# Patient Record
Sex: Female | Born: 1940 | Race: White | Hispanic: No | Marital: Married | Smoking: Never smoker
Health system: Southern US, Community
[De-identification: ages and names within clinical notes are randomized; demographics above are authoritative.]

## PROBLEM LIST (undated history)

## (undated) DIAGNOSIS — K219 Gastro-esophageal reflux disease without esophagitis: Secondary | ICD-10-CM

## (undated) DIAGNOSIS — M1990 Unspecified osteoarthritis, unspecified site: Secondary | ICD-10-CM

## (undated) DIAGNOSIS — I10 Essential (primary) hypertension: Secondary | ICD-10-CM

## (undated) HISTORY — PX: TONSILLECTOMY: SUR1361

## (undated) HISTORY — PX: JOINT REPLACEMENT: SHX530

---

## 1999-11-01 ENCOUNTER — Ambulatory Visit (HOSPITAL_COMMUNITY): Admission: RE | Admit: 1999-11-01 | Discharge: 1999-11-01 | Payer: Self-pay | Admitting: Gastroenterology

## 2004-03-23 ENCOUNTER — Ambulatory Visit: Payer: Self-pay | Admitting: Family Medicine

## 2004-07-27 ENCOUNTER — Ambulatory Visit: Payer: Self-pay | Admitting: Family Medicine

## 2004-11-30 ENCOUNTER — Ambulatory Visit: Payer: Self-pay | Admitting: Family Medicine

## 2005-01-24 ENCOUNTER — Encounter: Admission: RE | Admit: 2005-01-24 | Discharge: 2005-01-24 | Payer: Self-pay | Admitting: Gastroenterology

## 2005-04-07 ENCOUNTER — Ambulatory Visit: Payer: Self-pay | Admitting: Family Medicine

## 2005-08-10 ENCOUNTER — Ambulatory Visit: Payer: Self-pay | Admitting: Family Medicine

## 2006-01-25 ENCOUNTER — Ambulatory Visit: Payer: Self-pay | Admitting: Family Medicine

## 2006-06-01 ENCOUNTER — Ambulatory Visit: Payer: Self-pay | Admitting: Family Medicine

## 2006-11-30 ENCOUNTER — Inpatient Hospital Stay (HOSPITAL_COMMUNITY): Admission: RE | Admit: 2006-11-30 | Discharge: 2006-12-04 | Payer: Self-pay | Admitting: Orthopedic Surgery

## 2007-05-15 ENCOUNTER — Encounter: Admission: RE | Admit: 2007-05-15 | Discharge: 2007-05-15 | Payer: Self-pay | Admitting: Gastroenterology

## 2007-05-23 ENCOUNTER — Encounter: Admission: RE | Admit: 2007-05-23 | Discharge: 2007-05-23 | Payer: Self-pay | Admitting: Gastroenterology

## 2007-07-19 ENCOUNTER — Ambulatory Visit (HOSPITAL_COMMUNITY): Admission: RE | Admit: 2007-07-19 | Discharge: 2007-07-19 | Payer: Self-pay | Admitting: Gastroenterology

## 2007-10-23 ENCOUNTER — Inpatient Hospital Stay (HOSPITAL_COMMUNITY): Admission: RE | Admit: 2007-10-23 | Discharge: 2007-10-25 | Payer: Self-pay | Admitting: General Surgery

## 2007-10-23 ENCOUNTER — Encounter (INDEPENDENT_AMBULATORY_CARE_PROVIDER_SITE_OTHER): Payer: Self-pay | Admitting: General Surgery

## 2010-04-04 HISTORY — PX: LAPAROSCOPIC NISSEN FUNDOPLICATION: SHX1932

## 2010-05-26 ENCOUNTER — Other Ambulatory Visit: Payer: Self-pay | Admitting: Gastroenterology

## 2010-05-26 DIAGNOSIS — K225 Diverticulum of esophagus, acquired: Secondary | ICD-10-CM

## 2010-05-28 ENCOUNTER — Ambulatory Visit
Admission: RE | Admit: 2010-05-28 | Discharge: 2010-05-28 | Disposition: A | Discharge status: Home / Self Care | Payer: Medicare Other | Source: Ambulatory Visit | Attending: Gastroenterology | Admitting: Gastroenterology

## 2010-05-28 DIAGNOSIS — K225 Diverticulum of esophagus, acquired: Secondary | ICD-10-CM

## 2010-08-17 NOTE — Op Note (Signed)
NAMERUBBY, BARBARY               ACCOUNT NO.:  [id number]   MEDICAL RECORD NO.:  [id number]          PATIENT TYPE:  INP   LOCATION:  0007                         FACILITY:  [facility]   PHYSICIAN:  Sharlet Salina T. Hoxworth, M.D.DATE OF BIRTH:  [birth year]   DATE OF PROCEDURE:  10/23/2007  DATE OF DISCHARGE:                               OPERATIVE REPORT   BRIEF HISTORY:  Ms. Runions is a 70 year old female who presents with  worsening episodic postprandial epigastric abdominal pain.  She had a  thorough workup by Dr. Dorena Cookey which has included a gallbladder  ultrasound showing a single 1.5 cm gallstone.  She also has a known  large hiatal hernia with about 50% of her stomach in the chest with  moderate GERD.  Esophageal manometry was unremarkable.  She is felt to  have symptoms consistent with either her hiatal hernia or gallstones and  we have elected to proceed with repair of her hiatal hernia with Nissen  wrap and cholecystectomy laparoscopically.  Ashby Dawes of the procedure,  indications, risks of bleeding, infection, visceral injury, common bile  duct injury, bile leak and anesthetic risks have been discussed and  understood.  She is now brought to the operating room for this  procedure.   DESCRIPTION OF PROCEDURE:  Patient brought to the operating room, placed  in supine position on the operating table and general orotracheal  anesthesia was induced.  The abdomen was widely sterilely prepped and  draped.  She received preoperative antibiotics.  PAS were in place.  Correct patient and procedures were verified.  Access was obtained with  an 11 mm OptiVu trocar in the left subcostal space and pneumoperitoneum  established without difficulty.  Under direct vision, an 11 mm trocar  was placed in the upper right lateral abdomen, another 11 mm trocar in  the right mid abdomen, an 11 mm trocar just above and left of the  umbilicus for the camera port and  a 5 mm trocar in the left flank.  Through a 5 mm epigastric site the [facility] retractor was placed, left  lobe of the liver elevated with excellent exposure of the stomach and  the hiatus.  There was a large hiatal hernia obviously present with, as  expected, about half the stomach up in the thoracic cavity.  The stomach  was reduced back down into the abdominal cavity.  The gastrohepatic  omentum was divided with the harmonic scalpel.  There appeared to be a  replaced hepatic artery which was preserved.  The right crus was exposed  and identified.  Beginning laterally along the right crus, the  peritoneal sac was incised and this was carried out over the top of the  hiatus back down along the left crus.  The large hernia sac was then  completely stripped down out of the mediastinum completely mobilized  down to its attachment at the EG junction and then was removed with the  harmonic scalpel.  The esophagus was identified.  The EG junction  identified.  The short gastric vessels at the fundus were then divided  entering the lesser sac and  this dissection was carried up along the  fundus, dividing short gastrics with the harmonic scalpel and then  further attachments along the base of the left crus were completely  mobilized.  At this point, after complete dissection of the sac, the  stomach laid nicely in the abdomen under no tension with several  centimeters of intra-abdominal esophagus, again, without any tension  whatsoever.  Some further attachments posteriorly at the junction of the  crura were dissected until the EG junction and esophagus were completely  mobilized.  Posterior vagus was identified and protected.  At this  point, a crural repair was performed with interrupted 0 Surgilon sutures  pledgeted working from posterior to anterior, closing the crus down to a  normal hiatal opening.  A SurgiAssist hiatal hernia  soft tissue patch  was then moistened, introduced in the abdomen and oriented and placed  over  the crural repair.  The tails were then sutured together anteriorly  to the esophagus, tacking it to the diaphragm as well with nice broad  coverage of the repair.  It was further held in place with Tisseel  tissue sealant.  A 360 degree fundic wrap was then fashioned, bringing  the fundus back behind the esophagus finding contiguous area of fundus  on the left side for the 360 degree wrap.  A lighted 56 bougie dilator  was then passed and a wrap was constructed over the dilator with  interrupted 0 Surgilon sutures creating about a 3-4 cm wrap.  The  dilator was then removed and the wrap was seen to be nice and floppy.  The operative site was inspected for hemostasis or any evidence of  visceral injury and everything looked fine.  At this point, attention  was then turned to the cholecystectomy.  The Nathanson retractor was  removed and using the same port placement, the fundus of the gallbladder  was grasped, elevated up to the liver, the infundibulum retracted  inferolaterally.  The peritoneum anterior and posterior to Calot's  triangle was incised and fibrofatty tissue was stripped off the neck of  the gallbladder toward the porta hepatis.  Cystic artery and cystic duct  were defined, skeletonized, the cystic duct-gallbladder junction  dissected 360 degrees.  When the anatomy was clear, the cystic duct was  clipped at the gallbladder junction.  The cystic artery was clipped and  operative cholangiogram obtained through the cyst duct.  This showed  good filling of normal common bile duct and intrahepatic ducts with free  flow into the duodenum.  Following this, the cholangiocath was removed  and the cystic duct was triply clipped proximally and divided.  The  cystic artery was further clipped and divided.  The gallbladder was then  dissected free from its bed using hook cautery.  It was placed in an  EndoCatch bag and brought out through one of the 11 mm trocar sites.  Operative site was  thoroughly irrigated and complete hemostasis assured.  Following this, trocars removed and all CO2 was evacuated.  Skin  incisions were closed with subcuticular 4-0 Monocryl and Dermabond.  Sponge, needle and instrument counts were correct.  The patient was  taken to the recovery room in good condition.      Lorne Skeens. Hoxworth, M.D.  Electronically Signed     BTH/MEDQ  D:  10/23/2007  T:  10/23/2007  Job:  [id number]   cc:   Everardo All. Madilyn Fireman, M.D.  Fax: [phone]

## 2010-08-17 NOTE — Op Note (Signed)
Sheri Mccoy, Sheri Mccoy               ACCOUNT NO.:  [id number]   MEDICAL RECORD NO.:  [id number]          PATIENT TYPE:  INP   LOCATION:  0002                         FACILITY:  [facility]   PHYSICIAN:  Ollen Gross, M.D.    DATE OF BIRTH:  02-02-1941   DATE OF PROCEDURE:  11/30/2006  DATE OF DISCHARGE:                               OPERATIVE REPORT   ADDENDUM:  Dictation # for original report:  [id number]   The fascia lata is closed with interrupted #1 Vicryl, subcutaneous  closed with #1 and 2-0 Vicryl, and subcuticular running 4-0 Monocryl.  The incision is clean and dry and Steri-Strips and bulky sterile  dressing applied.  She was placed into a knee immobilizer, awakened, and  transported to the recovery room in stable condition.      Ollen Gross, M.D.  Electronically Signed     FA/MEDQ  D:  11/30/2006  T:  12/01/2006  Job:  [id number]

## 2010-08-17 NOTE — H&P (Signed)
Sheri Mccoy, Sheri Mccoy               ACCOUNT NO.:  [id number]   MEDICAL RECORD NO.:  [id number]          PATIENT TYPE:  INP   LOCATION:  0002                         FACILITY:  [facility]   PHYSICIAN:  Ollen Gross, M.D.    DATE OF BIRTH:  1940/10/22   DATE OF ADMISSION:  11/30/2006  DATE OF DISCHARGE:                              HISTORY & PHYSICAL   DATE OF OFFICE VISIT HISTORY AND PHYSICAL:  November 08, 2006   CHIEF COMPLAINT:  Left hip pain.   HISTORY OF PRESENT ILLNESS:  The patient is a 70 year old female who has  been seen by Dr. Lequita Halt for ongoing and progressive left hip pain.  It  has been getting worse for several years now.  It is more lateral and  radiating down the thigh.  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  It is felt she has reached a point  where she would benefit from undergoing surgical intervention.  Risks  and benefits have been discussed and she elects to proceed with surgery.  She has been seen by Dr. Lysbeth Galas and felt she would be stabilized and  ready for surgery.   ALLERGIES:  No known drug allergies.   CURRENT MEDICATIONS:  Lipitor, meloxicam, Micardis, Fosamax, Prilosec,  glucosamine chondroitin, baby aspirin.   PAST MEDICAL HISTORY:  1. Hypertension.  2. Hiatal hernia.  3. Reflux disease.  4. Hemorrhoids.  5. Osteoporosis.  6. Postmenopausal   PAST SURGICAL HISTORY:  1. Cesarean section.  2. Colonoscopy.  3. EGD.   SOCIAL HISTORY:  Married, retired.  Nonsmoker, no alcohol.  One child.   FAMILY HISTORY:  Father with history of stroke.   REVIEW OF SYSTEMS:  GENERAL:  No fevers, chills, night sweats.  NEURO:  No seizures, syncope, paralysis.  RESPIRATORY:  No shortness of breath,  productive cough or hemoptysis.  CARDIOVASCULAR:  No chest pain, angina,  orthopnea.  GI:  No nausea, vomiting, diarrhea, constipation.  GU:  No  dysuria, hematuria,  discharge.  MUSCULOSKELETAL:  Left hip.   PHYSICAL EXAMINATION:  VITAL SIGNS:  Pulse 80, respirations 12, blood  pressure 158/90.  GENERAL:  A 70 year old white female well-nourished, well-developed,  slightly overweight.  No acute distress.  She is alert, oriented,  cooperative, pleasant.  HEENT: Normocephalic, atraumatic.  Pupils round, reactive.  Oropharynx  clear.  EOMs intact.  NECK:  Supple.  No bruits.  CHEST:  Clear anterior and posterior chest walls.  No rhonchi, rales, or  wheezing.  HEART:  Regular rate and rhythm.  No murmur, S1 and S2 noted.  ABDOMEN:  Soft, nontender.  Bowel sounds present.  RECTAL, BREAST, GENITALIA:  Not done, not pertinent to present illness.  EXTREMITIES:  Left hip:  Left hip shows flexion 90,  zero internal  rotation, zero external rotation, no abduction, antalgic gait.   IMPRESSION:  Osteoarthritis left hip.   PLAN:  The patient will be admitted to [facility] to undergo a  left total replacement arthroplasty.  Surgery will be performed  by Dr.  Homero Fellers Aluisio.      Alexzandrew L. Perkins, P.A.C.      Ollen Gross, M.D.  Electronically Signed    ALP/MEDQ  D:  11/29/2006  T:  11/30/2006  Job:  [id number]   cc:   Delaney Meigs, M.D.  Fax: [phone]

## 2010-08-17 NOTE — Op Note (Signed)
[name], [name]               ACCOUNT NO.:  [id number]   MEDICAL RECORD NO.:  [id number]          PATIENT TYPE:  INP   LOCATION:  0002                         FACILITY:  [facility]   PHYSICIAN:  Ollen Gross, M.D.    DATE OF BIRTH:  28-Jan-1941   DATE OF PROCEDURE:  11/30/2006  DATE OF DISCHARGE:                               OPERATIVE REPORT   PREOPERATIVE DIAGNOSIS:  Osteoarthritis left hip with protrusio  deformity.   POSTOPERATIVE DIAGNOSIS:  Osteoarthritis left hip with protrusio  deformity.   PROCEDURE:  Left total hip arthroplasty with acetabular autografting.   SURGEON:  Ollen Gross, M.D.   ASSISTANT:  Alexzandrew L. Perkins, P.A.C.   ANESTHESIA:  General.   ESTIMATED BLOOD LOSS:  250 mL.   DRAINS:  None.   COMPLICATIONS:  None.   CONDITION:  Stable to recovery.   BRIEF CLINICAL NOTE:  Sheri Mccoy is a 70 year old female who has severe  end-stage arthritis of both hips left more symptomatic than the right.  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  She presents, now,  for a total hip arthroplasty.   PROCEDURE IN DETAIL:  After the successful administration of general  anesthetic, the patient is placed in the right lateral decubitus  position with the left side up and held with the hip positioner.  Left  lower extremity was isolated from the perineum with plastic drapes and  prepped and draped in the usual sterile fashion.   A short posterolateral incision was made with a 10-blade through  subcutaneous tissue to the level of the fascia lata which was incised in  line with the skin incision.  The sciatic nerve was palpated and  protected; and then the short rotator was isolated off the femur.  A  capsulectomy was performed.  There were a large circumferential rim of  osteophytes preventing the hip from being dislocated.  I removed the  osteophytes down to the normal acetabular rim; and then we were able to  dislocate the hip.  The center of  femoral head was marked; and the trial  prosthesis was placed, such that, the center of trial head corresponds  to the center of the native femoral head.  Osteotomy lines marked on the  femoral neck; and osteotomy made with an oscillating saw.   Femoral head was removed; and the femur retracted anteriorly to gain  acetabular exposure.  I removed a large rim of anterior osteophyte.  Retractors were placed and labrum was removed also.  Reaming started  essentially with 43 mm.  We then reamed the rim up to 49 mm.  I placed  some cancellus graft from the femoral head into the base of the  protrusio defect to more effectively create a normal center of rotation  for the acetabular component.  The size 50-mm pinnacle acetabular shell  was then impacted into the acetabulum in anatomic position.  We had  excellent purchase with this; and transfixed it with two additional dome  screws with excellent purchase.  The apex hole eliminator was placed and  then the 36-mm neutral Ultramet metal liner  was placed.   The femur was prepared with canal finder and irrigation.  Axial reaming  was performed to 15.5 mm.  Proximal reaming to 20-D and the sleeve  machine to a small.  A 20-D small trial sleeve is placed with a 20 x 15  stem, and a 36 plus 8 neck which was placed about 5 degrees less than  her normal anteversion.  She was successively anteverted.   The trial 36 with 0 head was placed; and introduced with outstanding  stability.  There was full extension, full external rotation, 70 degrees  flexion, 40 degrees adduction, and 90 degrees internal rotation, 90  degrees of flexion, 70 degrees of internal rotation.  The hip was then  dislocated.  Trials removed.  Permanent 20-D small sleeve was placed  with a 20 x 15 stem, 36 plus 8 neck, slightly less than her native  anteversion.  A 36 plus 0 head was placed and the hip was reduced with  the same stability parameters.  By placing the left leg on top of  the  right, I felt as though the leg lengths were equal.  The wound was then  copiously irrigated with saline solution; and the short rotators  reattached to the femur through drill holes.  The fascia lata was closed  over a Hemovac drain with interrupted   Dictation ended at this point.      Ollen Gross, M.D.  Electronically Signed     FA/MEDQ  D:  11/30/2006  T:  12/01/2006  Job:  [id number]

## 2010-08-20 NOTE — Discharge Summary (Signed)
Sheri Mccoy, Sheri Mccoy               ACCOUNT NO.:  [id number]   MEDICAL RECORD NO.:  [id number]          PATIENT TYPE:  INP   LOCATION:  1603                         FACILITY:  [facility]   PHYSICIAN:  Ollen Gross, M.D.    DATE OF BIRTH:  31-May-1940   DATE OF ADMISSION:  11/30/2006  DATE OF DISCHARGE:  12/04/2006                               DISCHARGE SUMMARY   ADMISSION DIAGNOSES:  1. Osteoarthritis, left hip.  2. Hypertension.  3. Hiatal hernia.  4. Reflux disease.  5. Hemorrhoids.  6. Osteoporosis.  7. Postmenopausal.   DISCHARGE DIAGNOSES:  1. Osteoarthritis, left hip, with protrusio deformity status post left      total hip arthroplasty with acetabular autografting.  2. Mild postoperative blood loss anemia, did not require transfusion.  3. Hypertension.  4. Hiatal hernia.  5. Reflux disease.  6. Hemorrhoids.  7. Osteoporosis.  8. Postmenopausal.   PROCEDURE:  November 30, 2006, left total hip arthroplasty with acetabular  autografting.  Surgeon Dr. Lequita Halt, assistant Avel Peace, PA-C.  Anesthesia general.  Consults none.   BRIEF HISTORY:  Ms. Steppe is a 70 year old female with severe end-  stage arthritis of both hips, left more symptomatic than right.  She has  a severe protrusio deformity and now presents for total hip.   LABORATORY DATA:  Preoperative CBC showed a hemoglobin of 13.7,  hematocrit 40.5, white cell count 10.9.  Postoperative hemoglobin 11.3,  drifting down to 10.6.  Last noted H&H back up to 10.7 and 31.6.  PT/PTT  on admission 12.0 and 26, respectively.  INR 0.9.  Serial protimes  followed.  Last noted PT/INR 20.1 and 1.7.  Chem panel on admission:  Minimally elevated BUN at 25, elevated glucose of 142.  Serial BMETs  were followed.  Sodium and potassium remained within normal limits.  Glucose came down to 120, BUN improved down to 8.  Preoperative UA  negative.  Blood group type A negative.   EKG preoperative, November 24, 2006, normal sinus rhythm,  normal EKG.  No  previous tracings.  Confirmed by Dr. Lucas Mallow.  Chest x-ray on November 24, 2006:  No acute findings, large hiatal hernia.  Left hip films:  Progressive left hip arthropathy with an acetabular protrusio done on  November 24, 2006.  Postoperative hip and pelvis films on November 30, 2006:  Status post left total hip.   HOSPITAL COURSE:  The patient was admitted to [facility].  She  tolerated the procedure well and was later transferred to the recovery  room and the orthopedic floor.  Started on PCA and p.o. analgesics for  pain control following surgery.  Had decent night and was doing pretty  well on the morning of day #1, with the exception of a little bit of  nausea, questionable to PCA.  We discontinued that later that day.  Hemoglobin looked good.  Had excellent urinary output.  Started to get  up out of bed with PT by day #2, doing well.  Hemoglobin and hematocrit  were stable.  Dressing was changed.  Incision looked good.  Started to  progress well with therapy throughout the weekend, walking about 50 feet  on day #2 and then nearly 100 feet by day #3 and day #4.  Seen on rounds  on day #3, progressing well and weaned over to p.o. meds and by day #4,  walking well, maintaining partial weightbearing.  Arranged discharge  planning and discharged home.   DISCHARGE PLAN:  The patient was discharged home on December 04, 2006.   DISCHARGE MEDICATIONS:  Discharge medications on discharge sheet  provided to the patient.   DIET:  Resume home diet.   ACTIVITY:  Partial weightbearing 25-50%, left lower extremity.  Home  health PT, home health nursing.  Total hip protocol, hip precautions.   FOLLOW UP:  2 weeks.   DISPOSITION:  Home.   CONDITION ON DISCHARGE:  Improving.      Alexzandrew L. Perkins, P.A.C.      Ollen Gross, M.D.  Electronically Signed    ALP/MEDQ  D:  01/02/2007  T:  01/02/2007  Job:  [id number]   cc:   Delaney Meigs, M.D.  Fax:  [phone]   Ollen Gross, M.D.  Fax: [phone]

## 2010-08-20 NOTE — Procedures (Signed)
[facility]  Patient:    Sheri Mccoy, Sheri Mccoy                      MRN: [id number] Proc. Date: 11/01/99 Adm. Date:  [id number] Attending:  Louie Bun CC:         Katherine Roan, M.D.             Dr. Fatima Blank                           Procedure Report  PROCEDURE PERFORMED:  Esophagogastroduodenoscopy.  ENDOSCOPIST:  Everardo All. Madilyn Fireman, M.D.  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  DESCRIPTION OF PROCEDURE:  The patient was placed in the left lateral decubitus position and placed on the pulse monitor with continuous low flow oxygen delivered by nasal cannula.  She was sedated with 60 mg IV Demerol and 6 mg IV Versed.  The Olympus video endoscope was advanced under direct vision into the oropharynx and esophagus.  The esophagus was straight.  This was somewhat tortuous but of normal caliber at the squamocolumnar line in a saw-toothed configuration at approximately 34 cm.  There was at least a 5 cm hiatal hernia distal to the Z-line.  It was difficult to discern exactly location of the lower esophageal sphincter as the GE junction was somewhat patulous and also due to the large hiatal hernia and tortuosity.  Therefore, I could not clearly ascertain whether Barretts esophagus was present.  If it was, it was felt to be no more than 2 to 3 cm in length.  There was no visible ring or stricture. The stomach was entered and a small amount of liquid secretions was suctioned from the fundus.  Retroflex view of the cardia confirmed the large hiatal hernia and was otherwise unremarkable.  The fundus, body, antrum and pylorus all appeared normal. The duodenum was entered and both the bulb and second portion were well inspected and appeared to be within normal limits.  The endoscope was then withdrawn and the patient returned to the recovery  room in stable condition.  The patient tolerated the procedure well and there were no immediate complications.  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  PLAN:  Will consider repeat EGD at the time of her next colonoscopy in five years. DD:  11/01/99 TD:  11/02/99 Job: [id number]

## 2010-08-20 NOTE — Procedures (Signed)
[facility]  Patient:    Sheri Mccoy, Sheri Mccoy                      MRN: [id number] Proc. Date: 11/01/99 Adm. Date:  [id number] Attending:  Louie Bun CC:         ________, M.D.  Katherine Roan, M.D.   Procedure Report  PROCEDURE PERFORMED:  Colonoscopy.  ENDOSCOPIST:  Everardo All. Madilyn Fireman, M.D.  INDICATIONS:  History of adenomatous colon polyps on index colonoscopy three years ago.  DESCRIPTION OF PROCEDURE:  The patient was placed in the left lateral decubitus position and placed on the pulse monitor with continuous low flow oxygen delivered by nasal cannula.  She was sedated with 2 mg of IV Versed and 20 mg of IV Demerol in addition to the 6 mg of Versed and 60 mg of Demerol given for the previous EGD.  The Olympus video colonoscope was inserted into the rectum and advanced to the cecum, confirmed by transillumination at McBurneys point and visualization of the ileocecal valve and appendiceal orifice.  The prep was excellent.  The cecum, ascending and transverse colon appeared normal with no masses, polyps, diverticula or other mucosal abnormalities.  Within the descending and sigmoid colon were seen several large-mouth diverticula and no other abnormalities.  The rectum appeared normal.  On retroflex view, the anus revealed no obvious internal hemorrhoids. The colonoscope was then withdrawn and the patient returned to the recovery room in stable condition.  She tolerated the procedure well and there were no immediate complications.  IMPRESSION:  Left-sided diverticulosis, otherwise normal colonoscopy. normal  PLAN:  Repeat colonoscopy in five years based on her prior history of polyps. DD:  11/01/99 TD:  11/02/99 Job: [id number]

## 2010-08-20 NOTE — Op Note (Signed)
Sheri Mccoy, Sheri Mccoy               ACCOUNT NO.:  [id number]   MEDICAL RECORD NO.:  [id number]          PATIENT TYPE:  AMB   LOCATION:  ENDO                         FACILITY:  [facility]   PHYSICIAN:  John C. Madilyn Fireman, M.D.    DATE OF BIRTH:  1940/07/04   DATE OF PROCEDURE:  DATE OF DISCHARGE:  07/19/2007                               OPERATIVE REPORT   INDICATION FOR PROCEDURE:  The patient with large hiatal hernia and  atypical reflux symptoms undergoing evaluation for anti-reflux surgery  as well as possible cholecystectomy.   PROCEDURE:  To assess for any esophageal motility disorder.   RESULTS:  1. Upper Esophageal Sphincter:  Normal resting pressure and      relaxation.  2. Esophageal Body:  A 100% peristalsis with normal amplitude,      velocity, and duration.  3. Lower Esophageal Sphincter:  Normal resting pressure with normal      relaxation at 79%.   IMPRESSION:  Normal study.   PLAN:  No contraindication to anti-reflux surgery.           ______________________________  Sheri Mccoy Madilyn Fireman, M.D.     JCH/MEDQ  D:  07/26/2007  T:  07/26/2007  Job:  [id number]

## 2010-08-20 NOTE — Discharge Summary (Signed)
Sheri Mccoy, Sheri Mccoy               ACCOUNT NO.:  [id number]   MEDICAL RECORD NO.:  [id number]          PATIENT TYPE:  INP   LOCATION:  1527                         FACILITY:  [facility]   PHYSICIAN:  Sharlet Salina T. Hoxworth, M.D.DATE OF BIRTH:  02/10/1941   DATE OF ADMISSION:  10/23/2007  DATE OF DISCHARGE:  10/25/2007                               DISCHARGE SUMMARY   DISCHARGE DIAGNOSES:  1. Giant hiatal hernia.  2. Cholelithiasis.   SURGICAL PROCEDURES:  1. Laparoscopic repair of hiatal hernia with Nissen fundoplication.  2. Cholecystectomy with intraoperative cholangiogram, surgeon Dr.      Johna Sheriff.   BRIEF HISTORY:  Sheri Mccoy is a 70 year old female followed by Dr.  Madilyn Fireman and Dr. Lysbeth Galas.  She has a number of years' history of chronic GI  complaints, but managed medically without difficulty.  Over the past  year, however, she has had gradually worsening problems with upper  abdominal pain and nausea and vomiting following meals.  She has  pressure-like pain in the epigastrium.  It has become more frequent and  severe and has become intolerable.  She has had a workup by Dr. Madilyn Fireman  including the upper GI series showing a very large hiatal hernia with  approximately 50% of the stomach above the diaphragm.  There was  moderate reflux.  Ultrasound revealed a 1.5 cm solitary gallstone as  well.  Colonoscopy showed only a benign polyp.   She is felt to have potentially symptomatic hiatal hernia,  cholelithiasis or both.  After discussion in the office we have elected  to proceed with laparoscopic repair of her hiatal hernia as well as  cholecystectomy with cholangiogram.  Manometry has shown normal  peristalsis as well.   PAST MEDICAL HISTORY:  Other surgery includes C-section, hip  replacement.  Medically she is followed for hypertension, elevated  cholesterol.   MEDICATIONS ON ADMISSION:  1. Lipitor 40 daily.  2. Nexium 40 daily.  3. Meloxicam 7.5 twice daily.  4. Micardis HCT  40 daily.  5. Baby aspirin 1 daily.  6. Travatan eye drops daily.  7. Combigan eye drops daily.   ALLERGIES:  None.   Social history, family history, review of systems, see detailed H&P.   PERTINENT PHYSICAL EXAMINATION:  She is 5 feet 5 inches, 210 pounds.  VITAL SIGNS:  Within normal limits.  GENERAL:  She is mildly obese.  ABDOMEN:  Soft and nontender.  No hernias or masses.   HOSPITAL COURSE:  The patient was admitted on the morning of her  procedure.  She underwent laparoscopic repair of a very large hiatal  hernia using a Surgisis tissue patch over the diaphragm and underwent  Nissen fundoplication.  Also cholecystectomy with cholangiogram.  She  tolerated the procedure well.  She was stable postoperatively on the 1st  postop day with some moderate pain.  Clear liquid diet was started which  she tolerated well.  White count at that point was elevated to 16.  On  the 2nd postoperative day she was feeling significantly better.  She was  tolerating a liquid diet without difficulty.  White count had decreased.  Abdomen was soft and nontender.  Wounds healing well.  She is discharged  at that time.  Final pathology revealed cholelithiasis and chronic  cholecystitis.  She will remain on a liquid diet for 2 weeks and see me  in the office at that time.      Lorne Skeens. Hoxworth, M.D.  Electronically Signed     BTH/MEDQ  D:  12/03/2007  T:  12/03/2007  Job:  [id number]   cc:   Everardo All. Madilyn Fireman, M.D.  Fax: [phone]   Delaney Meigs, M.D.  Fax: [phone]

## 2010-12-31 LAB — URINALYSIS, ROUTINE W REFLEX MICROSCOPIC
Bilirubin Urine: NEGATIVE
Glucose, UA: NEGATIVE
Hgb urine dipstick: NEGATIVE
Ketones, ur: NEGATIVE
Nitrite: NEGATIVE
Protein, ur: NEGATIVE
Specific Gravity, Urine: 1.024
Urobilinogen, UA: 0.2
pH: 5

## 2010-12-31 LAB — DIFFERENTIAL
Band Neutrophils: 0
Basophils Absolute: 0.1
Basophils Relative: 1
Blasts: 0
Eosinophils Absolute: 0.1
Eosinophils Relative: 1
Lymphocytes Relative: 28
Lymphs Abs: 2.7
Metamyelocytes Relative: 0
Monocytes Absolute: 0.8
Monocytes Relative: 8
Myelocytes: 0
Neutro Abs: 5.8
Neutrophils Relative %: 62
Promyelocytes Absolute: 0
nRBC: 0

## 2010-12-31 LAB — CBC
HCT: 35.3 — ABNORMAL LOW
HCT: 37.8
Hemoglobin: 11.9 — ABNORMAL LOW
Hemoglobin: 12.4
MCHC: 32.8
MCHC: 33.7
MCV: 82.9
MCV: 83.1
Platelets: 287
Platelets: 332
RBC: 4.24
RBC: 4.56
RDW: 14.6
RDW: 15.2
WBC: 16.3 — ABNORMAL HIGH
WBC: 9.5

## 2010-12-31 LAB — COMPREHENSIVE METABOLIC PANEL
ALT: 21
AST: 21
Albumin: 4
Alkaline Phosphatase: 91
BUN: 30 — ABNORMAL HIGH
CO2: 29
Calcium: 9.7
Chloride: 106
Creatinine, Ser: 0.77
GFR calc Af Amer: >60
GFR calc non Af Amer: >60
Glucose, Bld: 86
Potassium: 4.8
Sodium: 141
Total Bilirubin: 0.8
Total Protein: 6.8

## 2011-01-14 LAB — BASIC METABOLIC PANEL
BUN: 22
BUN: 8
CO2: 29
Calcium: 8.8
Chloride: 106
Creatinine, Ser: 0.53
Creatinine, Ser: 0.54
GFR calc Af Amer: >60
GFR calc Af Amer: >60
GFR calc non Af Amer: >60
GFR calc non Af Amer: >60
Glucose, Bld: 120 — ABNORMAL HIGH
Potassium: 3.8
Potassium: 4.8
Sodium: 141

## 2011-01-14 LAB — CBC
HCT: 31.5 — ABNORMAL LOW
HCT: 31.6 — ABNORMAL LOW
HCT: 33.6 — ABNORMAL LOW
HCT: 40.5
Hemoglobin: 10.7 — ABNORMAL LOW
Hemoglobin: 11.3 — ABNORMAL LOW
Hemoglobin: 13.7
MCHC: 33.6
MCHC: 33.7
MCV: 88.4
MCV: 89.8
Platelets: 234
Platelets: 267
Platelets: 327
RBC: 3.54 — ABNORMAL LOW
RBC: 3.74 — ABNORMAL LOW
RBC: 4.58
RDW: 13
RDW: 13.2
WBC: 10.9 — ABNORMAL HIGH
WBC: 11.1 — ABNORMAL HIGH
WBC: 11.8 — ABNORMAL HIGH
WBC: 13.1 — ABNORMAL HIGH

## 2011-01-14 LAB — URINALYSIS, ROUTINE W REFLEX MICROSCOPIC
Bilirubin Urine: NEGATIVE
Glucose, UA: NEGATIVE
Hgb urine dipstick: NEGATIVE
Ketones, ur: NEGATIVE
Nitrite: NEGATIVE
Protein, ur: NEGATIVE
Specific Gravity, Urine: 1.018
Urobilinogen, UA: 0.2
pH: 6

## 2011-01-14 LAB — COMPREHENSIVE METABOLIC PANEL
ALT: 19
AST: 19
Albumin: 3.9
Alkaline Phosphatase: 83
BUN: 25 — ABNORMAL HIGH
CO2: 23
Calcium: 9.6
Chloride: 107
Creatinine, Ser: 0.8
GFR calc Af Amer: >60
GFR calc non Af Amer: >60
Glucose, Bld: 142 — ABNORMAL HIGH
Potassium: 4
Sodium: 141
Total Bilirubin: 0.6
Total Protein: 6.7

## 2011-01-14 LAB — CROSSMATCH
ABO/RH(D): A NEG
Antibody Screen: NEGATIVE

## 2011-01-14 LAB — PROTIME-INR
INR: 1.7 — ABNORMAL HIGH
INR: 0.9
INR: 1.1
Prothrombin Time: 20.1 — ABNORMAL HIGH
Prothrombin Time: 12
Prothrombin Time: 14.3

## 2011-01-14 LAB — APTT: aPTT: 26

## 2011-01-14 LAB — ABO/RH: ABO/RH(D): A NEG

## 2013-12-11 ENCOUNTER — Encounter (HOSPITAL_COMMUNITY): Payer: Self-pay

## 2013-12-12 ENCOUNTER — Ambulatory Visit (HOSPITAL_COMMUNITY)
Admission: RE | Admit: 2013-12-12 | Discharge: 2013-12-12 | Disposition: A | Discharge status: Home / Self Care | Payer: Medicare Other | Source: Ambulatory Visit | Attending: Anesthesiology | Admitting: Anesthesiology

## 2013-12-12 ENCOUNTER — Encounter (HOSPITAL_COMMUNITY): Payer: Self-pay

## 2013-12-12 ENCOUNTER — Encounter (HOSPITAL_COMMUNITY)
Admission: RE | Admit: 2013-12-12 | Discharge: 2013-12-12 | Disposition: A | Discharge status: Home / Self Care | Payer: Medicare Other | Source: Ambulatory Visit | Attending: Orthopedic Surgery | Admitting: Orthopedic Surgery

## 2013-12-12 DIAGNOSIS — Z01818 Encounter for other preprocedural examination: Secondary | ICD-10-CM | POA: Diagnosis present

## 2013-12-12 DIAGNOSIS — K449 Diaphragmatic hernia without obstruction or gangrene: Secondary | ICD-10-CM | POA: Insufficient documentation

## 2013-12-12 HISTORY — DX: Gastro-esophageal reflux disease without esophagitis: K21.9

## 2013-12-12 HISTORY — DX: Unspecified osteoarthritis, unspecified site: M19.90

## 2013-12-12 HISTORY — DX: Essential (primary) hypertension: I10

## 2013-12-12 LAB — TYPE AND SCREEN
ABO/RH(D): A NEG
Antibody Screen: NEGATIVE

## 2013-12-12 LAB — BASIC METABOLIC PANEL
Anion gap: 14 (ref 5–15)
BUN: 24 mg/dL — AB (ref 6–23)
CALCIUM: 9.4 mg/dL (ref 8.4–10.5)
CO2: 25 meq/L (ref 19–32)
CREATININE: 0.73 mg/dL (ref 0.50–1.10)
Chloride: 104 mEq/L (ref 96–112)
GFR calc Af Amer: >90 mL/min (ref >90)
GFR, EST NON AFRICAN AMERICAN: 83 mL/min — AB (ref >90)
GLUCOSE: 106 mg/dL — AB (ref 70–99)
Potassium: 4 mEq/L (ref 3.7–5.3)
SODIUM: 143 meq/L (ref 137–147)

## 2013-12-12 LAB — APTT: APTT: 29 s (ref 24–37)

## 2013-12-12 LAB — CBC
HEMATOCRIT: 41.1 % (ref 36.0–46.0)
Hemoglobin: 13.4 g/dL (ref 12.0–15.0)
MCH: 28.5 pg (ref 26.0–34.0)
MCHC: 32.6 g/dL (ref 30.0–36.0)
MCV: 87.4 fL (ref 78.0–100.0)
PLATELETS: 283 10*3/uL (ref 150–400)
RBC: 4.7 MIL/uL (ref 3.87–5.11)
RDW: 13.9 % (ref 11.5–15.5)
WBC: 9.4 10*3/uL (ref 4.0–10.5)

## 2013-12-12 LAB — PROTIME-INR
INR: 1.01 (ref 0.00–1.49)
Prothrombin Time: 13.3 seconds (ref 11.6–15.2)

## 2013-12-12 LAB — ABO/RH: ABO/RH(D): A NEG

## 2013-12-12 LAB — SURGICAL PCR SCREEN
MRSA, PCR: NEGATIVE
Staphylococcus aureus: NEGATIVE

## 2013-12-12 NOTE — Progress Notes (Signed)
This pt. Has tested at an elevated risk for OSA using the STOP BANG tool during a pre-surgical visit. A score of 4 or greater is considered an elevated risk.

## 2013-12-12 NOTE — Pre-Procedure Instructions (Signed)
BRYLIE SNEATH  12/12/2013   Your procedure is scheduled on: 12-20-2013  Friday   Report to [facility] Admitting at 11:30 AM.   Call this number if you have problems the morning of surgery: [phone]   Remember:   Do not eat food or drink liquids after midnight.    Take these medicines the morning of surgery with A SIP OF WATER: nexium,micardis    Do not wear jewelry, make-up or nail polish.  Do not wear lotions, powders, or perfumes. You may not wear deodorant.   Do not shave 48 hours prior to surgery. Men may shave face and neck.  Do not bring valuables to the hospital.  [facility] is not responsible for any belongings or valuables.               Contacts, dentures or bridgework may not be worn into surgery.  Leave suitcase in the car. After surgery it may be brought to your room.  For patients admitted to the hospital, discharge time is determined by your  treatment team.               Patients discharged the day of surgery will not be allowed to drive home.    Special Instructions: See attached sheet for instructions on CHG shower/bath     Please read over the following fact sheets that you were given: Pain Booklet, Coughing and Deep Breathing and Surgical Site Infection Prevention

## 2013-12-12 NOTE — Progress Notes (Signed)
Dr. Dietrich Pates office notified that we need orders for [facility]

## 2013-12-17 NOTE — H&P (Signed)
  Sheri Mccoy is an 73 y.o. female.    Chief Complaint: right knee pain  HPI: Pt is a 73 y.o. female complaining of right knee pain for multiple years. Pain had continually increased since the beginning. X-rays in the clinic show end-stage arthritic changes of the right knee. Pt has tried various conservative treatments which have failed to alleviate their symptoms, including therapy and injections. Various options are discussed with the patient. Risks, benefits and expectations were discussed with the patient. Patient understand the risks, benefits and expectations and wishes to proceed with surgery.   PCP:  Josue Hector, MD  D/C Plans:  Home with HHPT  PMH: Past Medical History  Diagnosis Date  . Hypertension   . GERD (gastroesophageal reflux disease)   . Arthritis     PSH: Past Surgical History  Procedure Laterality Date  . Tonsillectomy    . Joint replacement Left   . Cesarean section      Social History:  reports that she has never smoked. She does not have any smokeless tobacco history on file. She reports that she does not drink alcohol or use illicit drugs.  Allergies:  No Known Allergies  Medications: No current facility-administered medications for this encounter.   Current Outpatient Prescriptions  Medication Sig Dispense Refill  . aspirin 81 MG tablet Take 81 mg by mouth daily.      Marland Kitchen atorvastatin (LIPITOR) 40 MG tablet Take 40 mg by mouth daily.      Marland Kitchen esomeprazole (NEXIUM) 40 MG capsule Take 40 mg by mouth daily at 12 noon.      . meloxicam (MOBIC) 7.5 MG tablet Take 7.5 mg by mouth 2 (two) times daily.      . potassium chloride SA (K-DUR,KLOR-CON) 20 MEQ tablet Take 10 mEq by mouth daily.      Marland Kitchen telmisartan-hydrochlorothiazide (MICARDIS HCT) 40-12.5 MG per tablet Take 1.5 tablets by mouth daily.        No results found for this or any previous visit (from the past 48 hour(s)). No results found.  ROS: Pain with rom of the right lower  extremity  Physical Exam:  Alert and oriented 73 y.o. female in no acute distress Cranial nerves 2-12 intact Cervical spine: full rom with no tenderness, nv intact distally Chest: active breath sounds bilaterally, no wheeze rhonchi or rales Heart: regular rate and rhythm, no murmur Abd: non tender non distended with active bowel sounds Hip is stable with rom  Right knee with moderate joint line tenderness and crepitus nv intact distally Minimally antalgic gait No rashes   Assessment/Plan Assessment: right knee end stage osteoarthritis  Plan: Patient will undergo a right total knee arthroplasty by Dr. Ranell Patrick at [facility]. Risks benefits and expectations were discussed with the patient. Patient understand risks, benefits and expectations and wishes to proceed.

## 2013-12-19 MED ORDER — CEFAZOLIN SODIUM-DEXTROSE 2-3 GM-% IV SOLR
2.0000 g | INTRAVENOUS | Status: AC
  Administered 2013-12-20: 2 g via INTRAVENOUS
  Filled 2013-12-19: qty 50

## 2013-12-19 NOTE — Anesthesia Preprocedure Evaluation (Addendum)
Anesthesia Evaluation  Patient identified by MRN, date of birth, ID band Patient awake    Reviewed: Allergy & Precautions, H&P , NPO status , Patient's Chart, lab work & pertinent test results  History of Anesthesia Complications Negative for: history of anesthetic complications  Airway Mallampati: II TM Distance: >3 FB Neck ROM: Full    Dental  (+) Dental Advisory Given   Pulmonary former smoker,  breath sounds clear to auscultation  Pulmonary exam normal       Cardiovascular hypertension, Pt. on medications - anginaRhythm:Regular Rate:Normal     Neuro/Psych negative neurological ROS     GI/Hepatic Neg liver ROS, hiatal hernia (small on CXR), GERD-  Medicated and Controlled,  Endo/Other  Morbid obesity  Renal/GU negative Renal ROS     Musculoskeletal   Abdominal (+) + obese,   Peds  Hematology negative hematology ROS (+)   Anesthesia Other Findings   Reproductive/Obstetrics                         Anesthesia Physical Anesthesia Plan  ASA: III  Anesthesia Plan: General   Post-op Pain Management: MAC Combined w/ Regional for Post-op pain   Induction: Intravenous  Airway Management Planned: LMA  Additional Equipment:   Intra-op Plan:   Post-operative Plan: Extubation in OR  Informed Consent: I have reviewed the patients History and Physical, chart, labs and discussed the procedure including the risks, benefits and alternatives for the proposed anesthesia with the patient or authorized representative who has indicated his/her understanding and acceptance.   Dental advisory given  Plan Discussed with: CRNA and Surgeon  Anesthesia Plan Comments: (Plan routine monitors, GA- LMA OK, femoral nerve block for post op analgesia)       Anesthesia Quick Evaluation

## 2013-12-19 NOTE — Progress Notes (Signed)
Notified pt. Of time change. Instructed to be here at 0945.

## 2013-12-20 ENCOUNTER — Inpatient Hospital Stay (HOSPITAL_COMMUNITY): Payer: Medicare Other

## 2013-12-20 ENCOUNTER — Inpatient Hospital Stay (HOSPITAL_COMMUNITY): Payer: Medicare Other | Admitting: Anesthesiology

## 2013-12-20 ENCOUNTER — Encounter (HOSPITAL_COMMUNITY): Payer: Medicare Other | Admitting: Anesthesiology

## 2013-12-20 ENCOUNTER — Inpatient Hospital Stay (HOSPITAL_COMMUNITY)
Admission: RE | Admit: 2013-12-20 | Discharge: 2013-12-23 | DRG: 470 | Disposition: A | Discharge status: Home / Self Care | Payer: Medicare Other | Source: Ambulatory Visit | Attending: Orthopedic Surgery | Admitting: Orthopedic Surgery

## 2013-12-20 ENCOUNTER — Encounter (HOSPITAL_COMMUNITY): Payer: Self-pay | Admitting: *Deleted

## 2013-12-20 ENCOUNTER — Encounter (HOSPITAL_COMMUNITY)
Admission: RE | Disposition: A | Discharge status: Home / Self Care | Payer: Self-pay | Source: Ambulatory Visit | Attending: Orthopedic Surgery

## 2013-12-20 DIAGNOSIS — Z79899 Other long term (current) drug therapy: Secondary | ICD-10-CM

## 2013-12-20 DIAGNOSIS — Z87891 Personal history of nicotine dependence: Secondary | ICD-10-CM

## 2013-12-20 DIAGNOSIS — R110 Nausea: Secondary | ICD-10-CM | POA: Diagnosis not present

## 2013-12-20 DIAGNOSIS — Z7901 Long term (current) use of anticoagulants: Secondary | ICD-10-CM

## 2013-12-20 DIAGNOSIS — K219 Gastro-esophageal reflux disease without esophagitis: Secondary | ICD-10-CM | POA: Diagnosis present

## 2013-12-20 DIAGNOSIS — M1710 Unilateral primary osteoarthritis, unspecified knee: Principal | ICD-10-CM | POA: Diagnosis present

## 2013-12-20 DIAGNOSIS — Z6836 Body mass index (BMI) 36.0-36.9, adult: Secondary | ICD-10-CM

## 2013-12-20 DIAGNOSIS — M25569 Pain in unspecified knee: Secondary | ICD-10-CM | POA: Diagnosis present

## 2013-12-20 DIAGNOSIS — Z7982 Long term (current) use of aspirin: Secondary | ICD-10-CM | POA: Diagnosis not present

## 2013-12-20 DIAGNOSIS — IMO0002 Reserved for concepts with insufficient information to code with codable children: Secondary | ICD-10-CM | POA: Diagnosis present

## 2013-12-20 DIAGNOSIS — I10 Essential (primary) hypertension: Secondary | ICD-10-CM | POA: Diagnosis present

## 2013-12-20 HISTORY — PX: TOTAL KNEE ARTHROPLASTY: SHX125

## 2013-12-20 SURGERY — ARTHROPLASTY, KNEE, TOTAL
Anesthesia: General | Site: Knee | Laterality: Right

## 2013-12-20 MED ORDER — ACETAMINOPHEN 650 MG RE SUPP: 650.0000 mg | Freq: Four times a day (QID) | RECTAL | Status: DC | PRN

## 2013-12-20 MED ORDER — METHOCARBAMOL 1000 MG/10ML IJ SOLN
500.0000 mg | Freq: Four times a day (QID) | INTRAVENOUS | Status: DC | PRN
  Filled 2013-12-20: qty 5

## 2013-12-20 MED ORDER — ONDANSETRON HCL 4 MG/2ML IJ SOLN
4.0000 mg | Freq: Four times a day (QID) | INTRAMUSCULAR | Status: DC | PRN
  Administered 2013-12-21 – 2013-12-23 (×4): 4 mg via INTRAVENOUS
  Filled 2013-12-20 (×4): qty 2

## 2013-12-20 MED ORDER — ACETAMINOPHEN 325 MG PO TABS
650.0000 mg | ORAL_TABLET | Freq: Four times a day (QID) | ORAL | Status: DC | PRN
  Administered 2013-12-20 – 2013-12-23 (×5): 650 mg via ORAL
  Filled 2013-12-20 (×4): qty 2

## 2013-12-20 MED ORDER — SODIUM CHLORIDE 0.9 % IR SOLN
Status: DC | PRN
Start: 2013-12-20 — End: 2013-12-20
  Administered 2013-12-20: 1000 mL

## 2013-12-20 MED ORDER — FENTANYL CITRATE 0.05 MG/ML IJ SOLN
INTRAMUSCULAR | Status: AC
  Administered 2013-12-20: 50 ug via INTRAVENOUS
  Filled 2013-12-20: qty 2

## 2013-12-20 MED ORDER — OXYCODONE HCL 5 MG PO TABS
ORAL_TABLET | ORAL | Status: AC
  Administered 2013-12-20: 10 mg via ORAL
  Filled 2013-12-20: qty 2

## 2013-12-20 MED ORDER — PHENYLEPHRINE HCL 10 MG/ML IJ SOLN
INTRAMUSCULAR | Status: DC | PRN
  Administered 2013-12-20 (×3): 80 ug via INTRAVENOUS

## 2013-12-20 MED ORDER — FERROUS SULFATE 325 (65 FE) MG PO TABS
325.0000 mg | ORAL_TABLET | Freq: Three times a day (TID) | ORAL | Status: DC
  Administered 2013-12-20 – 2013-12-22 (×5): 325 mg via ORAL
  Filled 2013-12-20 (×11): qty 1

## 2013-12-20 MED ORDER — BISACODYL 10 MG RE SUPP
10.0000 mg | Freq: Every day | RECTAL | Status: DC | PRN
  Administered 2013-12-22: 10 mg via RECTAL
  Filled 2013-12-20: qty 1

## 2013-12-20 MED ORDER — LIDOCAINE HCL (CARDIAC) 20 MG/ML IV SOLN
INTRAVENOUS | Status: AC
  Filled 2013-12-20: qty 5

## 2013-12-20 MED ORDER — ATORVASTATIN CALCIUM 40 MG PO TABS
40.0000 mg | ORAL_TABLET | Freq: Every day | ORAL | Status: DC
  Administered 2013-12-20 – 2013-12-23 (×4): 40 mg via ORAL
  Filled 2013-12-20 (×4): qty 1

## 2013-12-20 MED ORDER — PHENYLEPHRINE 40 MCG/ML (10ML) SYRINGE FOR IV PUSH (FOR BLOOD PRESSURE SUPPORT)
PREFILLED_SYRINGE | INTRAVENOUS | Status: AC
  Filled 2013-12-20: qty 10

## 2013-12-20 MED ORDER — WARFARIN SODIUM 5 MG PO TABS
5.0000 mg | ORAL_TABLET | Freq: Once | ORAL | Status: AC
  Administered 2013-12-20: 5 mg via ORAL
  Filled 2013-12-20: qty 1

## 2013-12-20 MED ORDER — WARFARIN SODIUM 5 MG PO TABS: 5.0000 mg | ORAL_TABLET | Freq: Every day | ORAL | Status: DC

## 2013-12-20 MED ORDER — BUPIVACAINE-EPINEPHRINE (PF) 0.5% -1:200000 IJ SOLN
INTRAMUSCULAR | Status: DC | PRN
  Administered 2013-12-20: 30 mL via PERINEURAL

## 2013-12-20 MED ORDER — HYDROMORPHONE HCL 1 MG/ML IJ SOLN
INTRAMUSCULAR | Status: DC | PRN
  Administered 2013-12-20 (×5): .2 mg via INTRAVENOUS

## 2013-12-20 MED ORDER — SCOPOLAMINE 1 MG/3DAYS TD PT72
MEDICATED_PATCH | TRANSDERMAL | Status: AC
  Filled 2013-12-20: qty 1

## 2013-12-20 MED ORDER — WARFARIN - PHARMACIST DOSING INPATIENT: Freq: Every day | Status: DC

## 2013-12-20 MED ORDER — PROPOFOL 10 MG/ML IV BOLUS
INTRAVENOUS | Status: AC
Start: 2013-12-20 — End: 2013-12-20
  Filled 2013-12-20: qty 20

## 2013-12-20 MED ORDER — PROPOFOL 10 MG/ML IV BOLUS
INTRAVENOUS | Status: DC | PRN
  Administered 2013-12-20: 150 mg via INTRAVENOUS

## 2013-12-20 MED ORDER — COUMADIN BOOK
Freq: Once | Status: DC
  Filled 2013-12-20: qty 1

## 2013-12-20 MED ORDER — LACTATED RINGERS IV SOLN
INTRAVENOUS | Status: DC
  Administered 2013-12-20 (×3): via INTRAVENOUS

## 2013-12-20 MED ORDER — METHOCARBAMOL 500 MG PO TABS
ORAL_TABLET | ORAL | Status: AC
  Administered 2013-12-20: 500 mg via ORAL
  Filled 2013-12-20: qty 1

## 2013-12-20 MED ORDER — PANTOPRAZOLE SODIUM 40 MG PO TBEC
80.0000 mg | DELAYED_RELEASE_TABLET | Freq: Every day | ORAL | Status: DC
  Administered 2013-12-21 – 2013-12-23 (×2): 80 mg via ORAL
  Filled 2013-12-20 (×2): qty 2

## 2013-12-20 MED ORDER — CHLORHEXIDINE GLUCONATE 4 % EX LIQD: 60.0000 mL | Freq: Once | CUTANEOUS | Status: AC

## 2013-12-20 MED ORDER — METHOCARBAMOL 500 MG PO TABS: 500.0000 mg | ORAL_TABLET | Freq: Three times a day (TID) | ORAL | Status: AC | PRN

## 2013-12-20 MED ORDER — ONDANSETRON HCL 4 MG/2ML IJ SOLN
INTRAMUSCULAR | Status: AC
  Filled 2013-12-20: qty 2

## 2013-12-20 MED ORDER — DIPHENHYDRAMINE HCL 50 MG/ML IJ SOLN
INTRAMUSCULAR | Status: AC
Start: 2013-12-20 — End: 2013-12-20
  Filled 2013-12-20: qty 1

## 2013-12-20 MED ORDER — METHOCARBAMOL 500 MG PO TABS
500.0000 mg | ORAL_TABLET | Freq: Four times a day (QID) | ORAL | Status: DC | PRN
  Administered 2013-12-20 – 2013-12-23 (×7): 500 mg via ORAL
  Filled 2013-12-20 (×6): qty 1

## 2013-12-20 MED ORDER — PROMETHAZINE HCL 25 MG/ML IJ SOLN: 6.2500 mg | INTRAMUSCULAR | Status: DC | PRN

## 2013-12-20 MED ORDER — ONDANSETRON HCL 4 MG PO TABS
4.0000 mg | ORAL_TABLET | Freq: Four times a day (QID) | ORAL | Status: DC | PRN
Start: 2013-12-20 — End: 2013-12-23

## 2013-12-20 MED ORDER — ASPIRIN 81 MG PO CHEW
81.0000 mg | CHEWABLE_TABLET | Freq: Every day | ORAL | Status: DC
  Administered 2013-12-20 – 2013-12-23 (×4): 81 mg via ORAL
  Filled 2013-12-20 (×4): qty 1

## 2013-12-20 MED ORDER — FENTANYL CITRATE 0.05 MG/ML IJ SOLN
25.0000 ug | INTRAMUSCULAR | Status: DC | PRN
  Administered 2013-12-20 (×3): 50 ug via INTRAVENOUS

## 2013-12-20 MED ORDER — POTASSIUM CHLORIDE CRYS ER 10 MEQ PO TBCR
10.0000 meq | EXTENDED_RELEASE_TABLET | Freq: Every day | ORAL | Status: DC
  Administered 2013-12-20 – 2013-12-23 (×4): 10 meq via ORAL
  Filled 2013-12-20 (×4): qty 1

## 2013-12-20 MED ORDER — MORPHINE SULFATE 2 MG/ML IJ SOLN
2.0000 mg | INTRAMUSCULAR | Status: DC | PRN
  Administered 2013-12-22: 2 mg via INTRAVENOUS
  Filled 2013-12-20: qty 1

## 2013-12-20 MED ORDER — FENTANYL CITRATE 0.05 MG/ML IJ SOLN
INTRAMUSCULAR | Status: DC | PRN
  Administered 2013-12-20 (×2): 25 ug via INTRAVENOUS
  Administered 2013-12-20: 100 ug via INTRAVENOUS
  Administered 2013-12-20: 50 ug via INTRAVENOUS
  Administered 2013-12-20 (×2): 25 ug via INTRAVENOUS

## 2013-12-20 MED ORDER — METOCLOPRAMIDE HCL 10 MG PO TABS
5.0000 mg | ORAL_TABLET | Freq: Three times a day (TID) | ORAL | Status: DC | PRN
  Administered 2013-12-22: 10 mg via ORAL
  Filled 2013-12-20: qty 1

## 2013-12-20 MED ORDER — MENTHOL 3 MG MT LOZG: 1.0000 | LOZENGE | OROMUCOSAL | Status: DC | PRN

## 2013-12-20 MED ORDER — PHENOL 1.4 % MT LIQD: 1.0000 | OROMUCOSAL | Status: DC | PRN

## 2013-12-20 MED ORDER — TELMISARTAN-HCTZ 40-12.5 MG PO TABS: 1.5000 | ORAL_TABLET | Freq: Every day | ORAL | Status: DC

## 2013-12-20 MED ORDER — ONDANSETRON HCL 4 MG/2ML IJ SOLN
INTRAMUSCULAR | Status: DC | PRN
Start: 2013-12-20 — End: 2013-12-20
  Administered 2013-12-20: 4 mg via INTRAVENOUS

## 2013-12-20 MED ORDER — HYDROCHLOROTHIAZIDE 12.5 MG PO CAPS
12.5000 mg | ORAL_CAPSULE | Freq: Every day | ORAL | Status: DC
  Administered 2013-12-20 – 2013-12-23 (×4): 12.5 mg via ORAL
  Filled 2013-12-20 (×5): qty 1

## 2013-12-20 MED ORDER — IRBESARTAN 150 MG PO TABS
150.0000 mg | ORAL_TABLET | Freq: Every day | ORAL | Status: DC
  Administered 2013-12-20 – 2013-12-23 (×4): 150 mg via ORAL
  Filled 2013-12-20 (×4): qty 1

## 2013-12-20 MED ORDER — SODIUM CHLORIDE 0.9 % IV SOLN
INTRAVENOUS | Status: DC
  Administered 2013-12-21 – 2013-12-22 (×2): via INTRAVENOUS

## 2013-12-20 MED ORDER — METOCLOPRAMIDE HCL 5 MG/ML IJ SOLN
5.0000 mg | Freq: Three times a day (TID) | INTRAMUSCULAR | Status: DC | PRN
  Administered 2013-12-21 – 2013-12-22 (×2): 10 mg via INTRAVENOUS
  Filled 2013-12-20 (×2): qty 2

## 2013-12-20 MED ORDER — HYDROMORPHONE HCL 1 MG/ML IJ SOLN
INTRAMUSCULAR | Status: AC
  Filled 2013-12-20: qty 1

## 2013-12-20 MED ORDER — MIDAZOLAM HCL 2 MG/2ML IJ SOLN
INTRAMUSCULAR | Status: AC
  Administered 2013-12-20: 50 mg
  Filled 2013-12-20: qty 2

## 2013-12-20 MED ORDER — INFLUENZA VAC SPLIT QUAD 0.5 ML IM SUSY
0.5000 mL | PREFILLED_SYRINGE | INTRAMUSCULAR | Status: AC
  Administered 2013-12-21: 0.5 mL via INTRAMUSCULAR
  Filled 2013-12-20: qty 0.5

## 2013-12-20 MED ORDER — OXYCODONE-ACETAMINOPHEN 5-325 MG PO TABS: 1.0000 | ORAL_TABLET | ORAL | Status: DC | PRN

## 2013-12-20 MED ORDER — ACETAMINOPHEN 325 MG PO TABS
ORAL_TABLET | ORAL | Status: AC
Start: 2013-12-20 — End: 2013-12-21
  Filled 2013-12-20: qty 2

## 2013-12-20 MED ORDER — MEPERIDINE HCL 25 MG/ML IJ SOLN: 6.2500 mg | INTRAMUSCULAR | Status: DC | PRN

## 2013-12-20 MED ORDER — WARFARIN VIDEO: Freq: Once | Status: DC

## 2013-12-20 MED ORDER — LIDOCAINE HCL (CARDIAC) 20 MG/ML IV SOLN
INTRAVENOUS | Status: DC | PRN
  Administered 2013-12-20: 15 mg via INTRAVENOUS

## 2013-12-20 MED ORDER — OXYCODONE HCL 5 MG PO TABS
5.0000 mg | ORAL_TABLET | ORAL | Status: DC | PRN
  Administered 2013-12-20 (×2): 10 mg via ORAL
  Administered 2013-12-20: 5 mg via ORAL
  Administered 2013-12-21 – 2013-12-23 (×7): 10 mg via ORAL
  Filled 2013-12-20 (×4): qty 2
  Filled 2013-12-20: qty 1
  Filled 2013-12-20 (×4): qty 2

## 2013-12-20 MED ORDER — DIPHENHYDRAMINE HCL 50 MG/ML IJ SOLN
INTRAMUSCULAR | Status: DC | PRN
  Administered 2013-12-20: 10 mg via INTRAVENOUS

## 2013-12-20 MED ORDER — DEXAMETHASONE SODIUM PHOSPHATE 4 MG/ML IJ SOLN
INTRAMUSCULAR | Status: AC
  Filled 2013-12-20: qty 1

## 2013-12-20 MED ORDER — SCOPOLAMINE 1 MG/3DAYS TD PT72
MEDICATED_PATCH | TRANSDERMAL | Status: DC | PRN
  Administered 2013-12-20: 1 via TRANSDERMAL

## 2013-12-20 MED ORDER — DEXAMETHASONE SODIUM PHOSPHATE 4 MG/ML IJ SOLN
INTRAMUSCULAR | Status: DC | PRN
  Administered 2013-12-20: 4 mg via INTRAVENOUS

## 2013-12-20 MED ORDER — FENTANYL CITRATE 0.05 MG/ML IJ SOLN
INTRAMUSCULAR | Status: AC
  Administered 2013-12-20: 50 ug
  Filled 2013-12-20: qty 2

## 2013-12-20 MED ORDER — CEFAZOLIN SODIUM-DEXTROSE 2-3 GM-% IV SOLR
2.0000 g | Freq: Four times a day (QID) | INTRAVENOUS | Status: AC
  Administered 2013-12-20 – 2013-12-21 (×2): 2 g via INTRAVENOUS
  Filled 2013-12-20 (×3): qty 50

## 2013-12-20 MED ORDER — FENTANYL CITRATE 0.05 MG/ML IJ SOLN
INTRAMUSCULAR | Status: AC
  Filled 2013-12-20: qty 5

## 2013-12-20 SURGICAL SUPPLY — 59 items
BANDAGE ELASTIC 6 VELCRO ST LF (GAUZE/BANDAGES/DRESSINGS) ×3 IMPLANT
BANDAGE ESMARK 6X9 LF (GAUZE/BANDAGES/DRESSINGS) ×1 IMPLANT
BLADE SAG 18X100X1.27 (BLADE) ×3 IMPLANT
BLADE SAW SGTL 13.0X1.19X90.0M (BLADE) ×3 IMPLANT
BNDG CMPR 9X6 STRL LF SNTH (GAUZE/BANDAGES/DRESSINGS) ×1
BNDG CMPR MED 10X6 ELC LF (GAUZE/BANDAGES/DRESSINGS) ×1
BNDG ELASTIC 6X10 VLCR STRL LF (GAUZE/BANDAGES/DRESSINGS) ×3 IMPLANT
BNDG ESMARK 6X9 LF (GAUZE/BANDAGES/DRESSINGS) ×3
BNDG GAUZE ELAST 4 BULKY (GAUZE/BANDAGES/DRESSINGS) ×3 IMPLANT
BOWL SMART MIX CTS (DISPOSABLE) ×3 IMPLANT
CAPT RP KNEE ×3 IMPLANT
CEMENT HV SMART SET (Cement) ×6 IMPLANT
CLOSURE STERI-STRIP 1/2X4 (GAUZE/BANDAGES/DRESSINGS) ×1
CLOSURE WOUND 1/2 X4 (GAUZE/BANDAGES/DRESSINGS) ×2
CLSR STERI-STRIP ANTIMIC 1/2X4 (GAUZE/BANDAGES/DRESSINGS) ×2 IMPLANT
COVER SURGICAL LIGHT HANDLE (MISCELLANEOUS) ×3 IMPLANT
CUFF TOURNIQUET SINGLE 34IN LL (TOURNIQUET CUFF) ×3 IMPLANT
CUFF TOURNIQUET SINGLE 44IN (TOURNIQUET CUFF) IMPLANT
DRAPE EXTREMITY T 121X128X90 (DRAPE) ×3 IMPLANT
DRAPE PROXIMA HALF (DRAPES) ×3 IMPLANT
DRAPE U-SHAPE 47X51 STRL (DRAPES) ×3 IMPLANT
DRSG ADAPTIC 3X8 NADH LF (GAUZE/BANDAGES/DRESSINGS) ×3 IMPLANT
DRSG PAD ABDOMINAL 8X10 ST (GAUZE/BANDAGES/DRESSINGS) ×3 IMPLANT
DURAPREP 26ML APPLICATOR (WOUND CARE) ×3 IMPLANT
ELECT CAUTERY BLADE 6.4 (BLADE) ×3 IMPLANT
ELECT REM PT RETURN 9FT ADLT (ELECTROSURGICAL) ×3
ELECTRODE REM PT RTRN 9FT ADLT (ELECTROSURGICAL) ×1 IMPLANT
GAUZE SPONGE 4X4 12PLY STRL (GAUZE/BANDAGES/DRESSINGS) ×3 IMPLANT
GLOVE BIOGEL PI ORTHO PRO 7.5 (GLOVE) ×2
GLOVE BIOGEL PI ORTHO PRO SZ8 (GLOVE) ×2
GLOVE ORTHO TXT STRL SZ7.5 (GLOVE) ×3 IMPLANT
GLOVE PI ORTHO PRO STRL 7.5 (GLOVE) ×1 IMPLANT
GLOVE PI ORTHO PRO STRL SZ8 (GLOVE) ×1 IMPLANT
GLOVE SURG ORTHO 8.5 STRL (GLOVE) ×3 IMPLANT
GOWN STRL REUS W/ TWL XL LVL3 (GOWN DISPOSABLE) ×3 IMPLANT
GOWN STRL REUS W/TWL XL LVL3 (GOWN DISPOSABLE) ×9
HANDPIECE INTERPULSE COAX TIP (DISPOSABLE) ×3
IMMOBILIZER KNEE 22 UNIV (SOFTGOODS) ×3 IMPLANT
KIT BASIN OR (CUSTOM PROCEDURE TRAY) ×3 IMPLANT
KIT MANIFOLD (MISCELLANEOUS) ×3 IMPLANT
KIT ROOM TURNOVER OR (KITS) ×3 IMPLANT
MANIFOLD NEPTUNE II (INSTRUMENTS) ×3 IMPLANT
NS IRRIG 1000ML POUR BTL (IV SOLUTION) ×3 IMPLANT
PACK TOTAL JOINT (CUSTOM PROCEDURE TRAY) ×3 IMPLANT
PAD ARMBOARD 7.5X6 YLW CONV (MISCELLANEOUS) ×6 IMPLANT
SET HNDPC FAN SPRY TIP SCT (DISPOSABLE) ×1 IMPLANT
STRIP CLOSURE SKIN 1/2X4 (GAUZE/BANDAGES/DRESSINGS) ×4 IMPLANT
SUCTION FRAZIER TIP 10 FR DISP (SUCTIONS) ×3 IMPLANT
SUT MNCRL AB 3-0 PS2 18 (SUTURE) ×3 IMPLANT
SUT VIC AB 0 CT1 27 (SUTURE) ×6
SUT VIC AB 0 CT1 27XBRD ANBCTR (SUTURE) ×2 IMPLANT
SUT VIC AB 1 CT1 27 (SUTURE) ×9
SUT VIC AB 1 CT1 27XBRD ANBCTR (SUTURE) ×3 IMPLANT
SUT VIC AB 2-0 CT1 27 (SUTURE) ×6
SUT VIC AB 2-0 CT1 TAPERPNT 27 (SUTURE) ×2 IMPLANT
TOWEL OR 17X24 6PK STRL BLUE (TOWEL DISPOSABLE) ×3 IMPLANT
TOWEL OR 17X26 10 PK STRL BLUE (TOWEL DISPOSABLE) ×3 IMPLANT
TRAY FOLEY CATH 16FRSI W/METER (SET/KITS/TRAYS/PACK) ×3 IMPLANT
WATER STERILE IRR 1000ML POUR (IV SOLUTION) ×6 IMPLANT

## 2013-12-20 NOTE — Progress Notes (Signed)
12/20/13 Set up by MD office for HHPT with [facility]. Spoke with Kipp Brood at [employer], they wil provide CPM, 3N1 and rolling walker. Jacquelynn Cree RN, BSN, CCM

## 2013-12-20 NOTE — Progress Notes (Signed)
Received patient from PACU post right knee surgery, patient  is arousable, not in any distress, on oxygen at 2LPM, CPM machine on, VSS. Oriented to unit . Will endorse appropriately.

## 2013-12-20 NOTE — Discharge Instructions (Signed)
Ice to the knee constantly!  Ok to put full weight on the right knee right away.  DO NOT PROP anything behind the knee, prop under the heel or ankle to work on extension of the knee.  Do exercises every hour while awake.  CPM is to be used in two hour sessions 3-4 times per day for 6-8 hours of use. Dangle knee over bed or chair at 90 degrees several times per day.  Keep the incision clean and dry for one week, then ok to get wet in the shower.  Follow up in the office in two weeks  [phone]

## 2013-12-20 NOTE — Interval H&P Note (Signed)
History and Physical Interval Note:  12/20/2013 11:19 AM  Sheri Mccoy  has presented today for surgery, with the diagnosis of RIGHT KNEE OA   The various methods of treatment have been discussed with the patient and family. After consideration of risks, benefits and other options for treatment, the patient has consented to  Procedure(s): RIGHT TOTAL KNEE ARTHROPLASTY (Right) as a surgical intervention .  The patient's history has been reviewed, patient examined, no change in status, stable for surgery.  I have reviewed the patient's chart and labs.  Questions were answered to the patient's satisfaction.     Fahed Morten,STEVEN R

## 2013-12-20 NOTE — Progress Notes (Signed)
Orthopedic Tech Progress Note Patient Details:  Sheri Mccoy 26-Jul-1940 [id number]  CPM Right Knee CPM Right Knee: On Right Knee Flexion (Degrees): 60 Right Knee Extension (Degrees): 0 Additional Comments: trapeze bar patient helper  Bone foam; viewed order from doctor's order list Nikki Dom 12/20/2013, 3:12 PM

## 2013-12-20 NOTE — Anesthesia Procedure Notes (Addendum)
Anesthesia Regional Block:  Femoral nerve block  Pre-Anesthetic Checklist: ,, timeout performed, Correct Patient, Correct Site, Correct Laterality, Correct Procedure, Correct Position, site marked, Risks and benefits discussed,  Surgical consent,  Pre-op evaluation,  At surgeon's request and post-op pain management  Laterality: Right and Lower  Prep: chloraprep       Needles:  Injection technique: Single-shot  Needle Type: Echogenic Stimulator Needle      Needle Gauge: 22 and 22 G    Additional Needles:  Procedures: nerve stimulator Femoral nerve block  Nerve Stimulator or Paresthesia:  Response: patella twitch, 0.45 mA, 0.1 ms,   Additional Responses:   Narrative:  Start time: 12/20/2013 10:58 AM End time: 12/20/2013 11:04 AM Injection made incrementally with aspirations every 5 mL.  Performed by: Personally  Anesthesiologist: Sandford Craze, MD  Additional Notes: Pt identified in Holding room.  Monitors applied. Working IV access confirmed. Sterile prep R groin.  #22ga PNS to patella twitch at 0.41mA threshold.  30cc 0.5% Bupivacaine with 1:200k epi injected incrementally after negative test dose.  Patient asymptomatic, VSS, no heme aspirated, tolerated well.  Sandford Craze, MD   Procedure Name: LMA Insertion Date/Time: 12/20/2013 11:37 AM Performed by: Jerilee Hoh Pre-anesthesia Checklist: Patient identified, Emergency Drugs available, Patient being monitored and Suction available Patient Re-evaluated:Patient Re-evaluated prior to inductionOxygen Delivery Method: Circle system utilized Preoxygenation: Pre-oxygenation with 100% oxygen Intubation Type: IV induction Ventilation: Mask ventilation without difficulty LMA: LMA inserted LMA Size: 4.0 Tube type: Oral Number of attempts: 1 Placement Confirmation: ETT inserted through vocal cords under direct vision,  positive ETCO2 and breath sounds checked- equal and bilateral Tube secured with: Tape Dental Injury: Teeth and  Oropharynx as per pre-operative assessment

## 2013-12-20 NOTE — Progress Notes (Signed)
ANTICOAGULATION CONSULT NOTE - Initial Consult  Pharmacy Consult for Warfarin Indication: VTE prophylaxis  No Known Allergies  Patient Measurements: Height:  (165.1 cm) Weight: 216 lb 9 oz (98.232 kg) IBW/kg (Calculated) : 57  Vital Signs: Temp: 97.5 F (36.4 C) (09/18 1527) Temp src: Oral (09/18 0959) BP: 158/66 mmHg (09/18 1527) Pulse Rate: 84 (09/18 1527)  Labs: No results found for this basename: HGB, HCT, PLT, APTT, LABPROT, INR, HEPARINUNFRC, CREATININE, CKTOTAL, CKMB, TROPONINI,  in the last 72 hours  Estimated Creatinine Clearance: 73.8 ml/min (by C-G formula based on Cr of 0.73).   Medical History: Past Medical History  Diagnosis Date  . Hypertension   . GERD (gastroesophageal reflux disease)   . Arthritis     Medications:  Prescriptions prior to admission  Medication Sig Dispense Refill  . aspirin 81 MG tablet Take 81 mg by mouth daily.      Marland Kitchen atorvastatin (LIPITOR) 40 MG tablet Take 40 mg by mouth daily.      Marland Kitchen esomeprazole (NEXIUM) 40 MG capsule Take 40 mg by mouth daily at 12 noon.      . meloxicam (MOBIC) 7.5 MG tablet Take 7.5 mg by mouth 2 (two) times daily.      . potassium chloride SA (K-DUR,KLOR-CON) 20 MEQ tablet Take 10 mEq by mouth daily.      Marland Kitchen telmisartan-hydrochlorothiazide (MICARDIS HCT) 40-12.5 MG per tablet Take 1.5 tablets by mouth daily.       Scheduled:  . acetaminophen      . aspirin  81 mg Oral Daily  . atorvastatin  40 mg Oral Daily  .  ceFAZolin (ANCEF) IV  2 g Intravenous Q6H  . ferrous sulfate  325 mg Oral TID PC  . [START ON 12/21/2013] pantoprazole  80 mg Oral Q1200  . potassium chloride SA  10 mEq Oral Daily   Infusions:  . sodium chloride      Assessment: 73yo female presents with R knee end stage osteoarthritis to undergo R total knee arthroplasty. Pharmacy is consulted to dose warfarin for post-procedure VTE prophylaxis. CBC and sCr are ok and baseline INR is 1.01.  Goal of Therapy:  INR 2-3 Monitor platelets  by anticoagulation protocol: Yes   Plan:  Give warfarin  tonight x 1 Daily INR/CBC Continue to monitor H&H and platelets and s/sx of bleeding  Arlean Hopping. Newman Pies, PharmD Clinical Pharmacist Pager [phone] 12/20/2013,3:58 PM

## 2013-12-20 NOTE — Progress Notes (Signed)
Orthopedic Tech Progress Note Patient Details:  Sheri Mccoy [birth year] [id number]  Patient ID: [name], female   DOB: 11-Nov-1940, 73 y.o.   MRN: [id number] Placed pt's rle in cpm -60 degrees   Nikki Dom 12/20/2013, 8:06 PM

## 2013-12-20 NOTE — Progress Notes (Signed)
Utilization review completed.  

## 2013-12-20 NOTE — Op Note (Signed)
Sheri Mccoy, Sheri Mccoy               ACCOUNT NO.:  [id number]  MEDICAL RECORD NO.:  [id number]  LOCATION:  5N12C                        FACILITY:  [facility]  PHYSICIAN:  Almedia Balls. Ranell Patrick, M.D. DATE OF BIRTH:  25-Apr-1940  DATE OF PROCEDURE: DATE OF DISCHARGE:                              OPERATIVE REPORT   PREOPERATIVE DIAGNOSIS:  Right knee end-stage osteoarthritis.  POSTOP DIAGNOSIS:  Right knee end-stage osteoarthritis.  PROCEDURE PERFORMED:  Right total knee replacement using DePuy Sigma rotating platform.  ATTENDING SURGEON:  Almedia Balls. Ranell Patrick, MD  ASSISTANT:  Konrad Felix Dixon PA-C who was scrubbed during the entire procedure and necessary for satisfactory completion of surgery.  ANESTHESIA:  General anesthesia was used plus femoral block.  ESTIMATED BLOOD LOSS:  Minimal.  FLUID REPLACED:  1200 mL crystalloids.  INSTRUMENT COUNTS:  Correct.  COMPLICATIONS:  There were no complications.  ANTIBIOTICS:  Perioperative antibiotics were given.  INDICATIONS:  Patient is a 73 year old female with worsening right knee pain secondary to end-stage arthritis.  The patient has bone-on-bone arthritis on x-ray and cannot walk more than a block without having to sit down secondary to pain in her knee.  She does occasionally use an assistive device to walk, due to the imbalance of the knee giving out, the patient has failed all measures of conservative management including injections, modification activity, pain medication presents for operative treatment including total knee replacement for relief of knee pain and restoration of function.  Informed consent obtained.  DESCRIPTION OF PROCEDURE:  After an adequate level of anesthesia achieved, the patient was positioned in the supine position.  Right leg correctly identified.  Nonsterile tourniquet placed in right proximal thigh.  Right leg was sterilely prepped and draped in usual manner. Time-out was called.  We entered the knee using  standard midline incision, we first exsanguinated the limb using an Esmarch bandage and elevated the tourniquet to 300 mmHg.  We made that incision with the knee in flexion.  Dissection down through subcutaneous tissues using the 10 blade scalpel, identified the median parapatellar tissue and performed a median parapatellar arthrotomy, everting the patella and divided the lateral patellofemoral ligaments, advanced arthritis was noted.  The large loose bodies were recovered from the knee.  There was exposed bone, pretty much throughout the entire joint in all 3 compartments.  Advanced synovitis noted as well.  We removed all loose bodies and marginal osteophytes.  We then went ahead and entered the distal femur using a step-cut drill.  We placed an intramedullary resection guide set on 11 mm resection, 5 degrees right.  We had noted preoperatively the patient had about a 10 degree flexion contracture. The decision for 11 mm resection, she also only had 50 degrees of flexion, with our exam before, we went ahead and started surgery.  Once we had entered the femur and placed our intramedullary resection guide and made our distal cut, we sized the femur to a size 2 anterior down and then selected the size 2, 4-in-1 block and impacted that on the distal femur with appropriate positioning.  We then resected anterior- posterior and chamfer cuts with a 4-in-1 block removing excess osteophytes in the process, removed ACL, PCL,  meniscal tissue, subluxed the tibia anteriorly, performed a tibial cut with an oscillating saw, 90 degrees perpendicular long axis of the tibia with just 2 mm off the affected medial side.  We did some more soft tissue release around the medial to proximal tibia.  It was appeared that patient was a little tighter medially than laterally due to chronic loss of cartilage in that medial compartment and we also removed an osteophyte off the medial tib, proximal tibia as well.   Once that was prepared, we went ahead and placed our lamina spreader between the femur and tibia, resected the remaining posterior bone off the femoral condyles and then went ahead and checked our gaps which were symmetric at 12.5 mm.  We then went ahead and completed our tibial preparation sizing to a 2.5 tibia and then using a modular drilling keel punch to complete tibial preparation impacting our trial prosthesis in place.  We then used our notch cut guide to perform our box cut on the femur and then placed R2 right femur in place and impacted that in position, reduced the knee with a 12.5 poly and was able to get full extension, actually little bit of hyperextension, we felt like we definitely get a 15 in place.  We then resurfaced our patella that was only about between 15 and 18 mm thick, we just skinned the top of the patella to leave about a 14-15 mm thickness, but we were able to get down to a nice flat bone surface.  We went and drilled our lugs for a 32 patella and then placed a 32 patellar button in place and then ranged the knee, it was perfect tracking with no-touch technique.  We removed all trial components, thoroughly pulse irrigated the knee and then cemented the components into place using DePuy Smart set cement with vacuum mixing.  Once we had all the components cemented including tibia, femur, and patella, we reduced the knee with a 12.5 poly insert, held in an extension while the cement hardened, also used a patellar clamp on the patella itself.  We removed excess cement using 1/4-inch curved osteotome.  After all cement was dry, we then went ahead and placed a 15 trial and then we were happy with that soft tissue balancing.  We removed the trial component, thoroughly irrigated the knee, and then inserted the real size 50 mm size 2 tibial poly component in place, reduced the knee with a nice snap on the medial side and had full extension and nice flexion stability  and excellent range of motion.  We thoroughly irrigated the knee with pulse irrigator and we repaired the medial parapatellar arthrotomy with #1 Vicryl suture followed by 0 and 2-0 Vicryl layered subcutaneous closure, and 4-0 Monocryl for skin.  Steri-Strips applied followed by sterile dressing.  The patient tolerated surgery well.     Almedia Balls. Ranell Patrick, M.D.     SRN/MEDQ  D:  12/20/2013  T:  12/20/2013  Job:  [id number]

## 2013-12-20 NOTE — Brief Op Note (Signed)
12/20/2013  1:58 PM  PATIENT:  Sheri Mccoy  73 y.o. female  PRE-OPERATIVE DIAGNOSIS:  RIGHT KNEE OA, END STAGE  POST-OPERATIVE DIAGNOSIS:  RIGHT KNEE OA, END STAGE  PROCEDURE:  Procedure(s): RIGHT TOTAL KNEE ARTHROPLASTY (Right), DePuy Sigma RP  SURGEON:  Surgeon(s) and Role:    * Verlee Rossetti, MD - Primary  PHYSICIAN ASSISTANT:   ASSISTANTS: Thea Gist, PA-C   ANESTHESIA:   regional and general  EBL:  Total I/O In: 2000 [I.V.:2000] Out: 215 [Urine:215]  BLOOD ADMINISTERED:none  DRAINS: none   LOCAL MEDICATIONS USED:  none   SPECIMEN:  No Specimen  DISPOSITION OF SPECIMEN:  N/A  COUNTS:  YES  TOURNIQUET:   Total Tourniquet Time Documented: Thigh (Right) - 105 minutes Total: Thigh (Right) - 105 minutes   DICTATION: .Other Dictation: Dictation Number [id number]  PLAN OF CARE: Admit to inpatient   PATIENT DISPOSITION:  PACU - hemodynamically stable.   Delay start of Pharmacological VTE agent (>24hrs) due to surgical blood loss or risk of bleeding: no

## 2013-12-20 NOTE — Transfer of Care (Signed)
Immediate Anesthesia Transfer of Care Note  Patient: Sheri Mccoy  Procedure(s) Performed: Procedure(s): RIGHT TOTAL KNEE ARTHROPLASTY (Right)  Patient Location: PACU  Anesthesia Type:General  Level of Consciousness: awake, alert , oriented and patient cooperative  Airway & Oxygen Therapy: Patient Spontanous Breathing and Patient connected to face mask oxygen  Post-op Assessment: Report given to PACU RN, Post -op Vital signs reviewed and stable and Patient moving all extremities  Post vital signs: Reviewed and stable  Complications: No apparent anesthesia complications

## 2013-12-21 DIAGNOSIS — M1710 Unilateral primary osteoarthritis, unspecified knee: Secondary | ICD-10-CM | POA: Diagnosis not present

## 2013-12-21 LAB — PROTIME-INR
INR: 1.06 (ref 0.00–1.49)
PROTHROMBIN TIME: 13.8 s (ref 11.6–15.2)

## 2013-12-21 LAB — CBC
HCT: 36.1 % (ref 36.0–46.0)
Hemoglobin: 11.9 g/dL — ABNORMAL LOW (ref 12.0–15.0)
MCH: 29.6 pg (ref 26.0–34.0)
MCHC: 33 g/dL (ref 30.0–36.0)
MCV: 89.8 fL (ref 78.0–100.0)
PLATELETS: 293 10*3/uL (ref 150–400)
RBC: 4.02 MIL/uL (ref 3.87–5.11)
RDW: 13.9 % (ref 11.5–15.5)
WBC: 12.8 10*3/uL — AB (ref 4.0–10.5)

## 2013-12-21 LAB — BASIC METABOLIC PANEL
ANION GAP: 12 (ref 5–15)
BUN: 19 mg/dL (ref 6–23)
CALCIUM: 8.6 mg/dL (ref 8.4–10.5)
CHLORIDE: 95 meq/L — AB (ref 96–112)
CO2: 26 mEq/L (ref 19–32)
Creatinine, Ser: 0.64 mg/dL (ref 0.50–1.10)
GFR calc non Af Amer: 87 mL/min — ABNORMAL LOW (ref >90)
Glucose, Bld: 111 mg/dL — ABNORMAL HIGH (ref 70–99)
Potassium: 4 mEq/L (ref 3.7–5.3)
SODIUM: 133 meq/L — AB (ref 137–147)

## 2013-12-21 MED ORDER — WARFARIN SODIUM 5 MG PO TABS
5.0000 mg | ORAL_TABLET | Freq: Once | ORAL | Status: AC
  Administered 2013-12-21: 5 mg via ORAL
  Filled 2013-12-21: qty 1

## 2013-12-21 NOTE — Evaluation (Signed)
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 PMH includes HTN, GERD, and OA, left THA 6 yrs ago.   Clinical Impression   Pt s/p R TKA.Marland Kitchen Pt will benefit from continued acute OT services to address below problem list. Recommending 3n1 for d/c home.    Follow Up Recommendations  Supervision/Assistance - 24 hour    Equipment Recommendations  3 in 1 bedside comode    Recommendations for Other Services       Precautions / Restrictions Precautions Precautions: Knee Required Braces or Orthoses: Knee Immobilizer - Right Restrictions Weight Bearing Restrictions: Yes RLE Weight Bearing: Weight bearing as tolerated      Mobility Bed Mobility Overal bed mobility: Needs Assistance Bed Mobility: Supine to Sit;Sit to Supine     Supine to sit: Min assist Sit to supine: Min assist   General bed mobility comments: Assist to support RLE.  Transfers Overall transfer level: Needs assistance Equipment used: Rolling walker (2 wheeled) Transfers: Sit to/from Stand Sit to Stand: Mod assist         General transfer comment: Assist to power up. VC for hand placment.    Balance                                            ADL Overall ADL's : Needs assistance/impaired         Upper Body Bathing: Supervision/ safety;Set up;Sitting   Lower Body Bathing: Moderate assistance;Sit to/from stand   Upper Body Dressing : Supervision/safety;Set up;Sitting   Lower Body Dressing: Maximal assistance;Sit to/from stand   Toilet Transfer: Ambulation;RW;Moderate assistance           Functional mobility during ADLs: Rolling walker;Moderate assistance General ADL Comments: Pt in bed upon OT arrival and requesting to go back to sleep in bed when finished. Had sat in chair most of morning.      Vision                     Perception     Praxis      Pertinent Vitals/Pain Pain Assessment: 0-10 Pain Score: 7  Pain Location: right knee Pain Intervention(s): Monitored during session     Hand Dominance     Extremity/Trunk Assessment Upper Extremity Assessment Upper Extremity Assessment: Overall WFL for tasks assessed   Lower Extremity Assessment Lower Extremity Assessment: Defer to PT evaluation       Communication Communication Communication: No difficulties   Cognition Arousal/Alertness: Lethargic Behavior During Therapy: WFL for tasks assessed/performed Overall Cognitive Status: Within Functional Limits for tasks assessed                     General Comments       Exercises       Shoulder Instructions      Home Living Family/patient expects to be discharged to:: Private residence Living Arrangements: Spouse/significant other Available Help at Discharge: Family;Available 24 hours/day Type of Home: House Home Access: Stairs to enter [employer] of Steps: 3 Entrance Stairs-Rails: Right;Left;Can reach both Home Layout: Multi-level;Able to live on main level with bedroom/bathroom Alternate Level Stairs-Number of Steps: flight   Bathroom Shower/Tub: Chief Strategy Officer: Handicapped height  Home Equipment: Walker - 2 wheels          Prior Functioning/Environment Level of Independence: Independent             OT Diagnosis: Generalized weakness;Acute pain   OT Problem List: Decreased strength;Decreased activity tolerance;Impaired balance (sitting and/or standing);Decreased safety awareness;Decreased knowledge of use of DME or AE;Decreased knowledge of precautions;Pain   OT Treatment/Interventions: Self-care/ADL training;DME and/or AE instruction;Therapeutic activities;Patient/family education;Balance training    OT Goals(Current goals can be found in the care plan section) Acute Rehab OT  Goals Patient Stated Goal: return home OT Goal Formulation: With patient Time For Goal Achievement: 12/28/13 Potential to Achieve Goals: Good  OT Frequency: Min 2X/week   Barriers to D/C:            Co-evaluation              End of Session Equipment Utilized During Treatment: Gait belt;Rolling walker CPM Right Knee CPM Right Knee: On Right Knee Flexion (Degrees): 60 Right Knee Extension (Degrees): 0  Activity Tolerance: Patient limited by fatigue;Patient limited by lethargy Patient left: in bed;in CPM;with family/visitor present;with call bell/phone within reach   Time: 1047-1103 OT Time Calculation (min): 16 min Charges:  OT General Charges $OT Visit: 1 Procedure OT Evaluation $Initial OT Evaluation Tier I: 1 Procedure OT Treatments $Self Care/Home Management : 8-22 mins G-Codes:    Cipriano Mile 12/21/2013, 3:11 PM  12/21/2013 Cipriano Mile OTR/L Pager [phone] Office [phone]

## 2013-12-21 NOTE — Progress Notes (Signed)
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 PMH includes HTN, GERD, and OA, left THA 6 yrs ago.    PT Comments    Pt progressing well with mobility despite issues with pain and nausea today. Ambulated 68' with min A and RW and tolerated there ex well this afternoon. PT will continue to follow.   Follow Up Recommendations  Home health PT;Supervision - Intermittent     Equipment Recommendations  None recommended by PT    Recommendations for Other Services       Precautions / Restrictions Precautions Precautions: Knee Required Braces or Orthoses: Knee Immobilizer - Right Restrictions Weight Bearing Restrictions: Yes RLE Weight Bearing: Weight bearing as tolerated    Mobility  Bed Mobility Overal bed mobility: Needs Assistance Bed Mobility: Supine to Sit;Sit to Supine     Supine to sit: Min assist;Mod assist Sit to supine: Min assist   General bed mobility comments: min A to initiate motion to EOB, mod A to slide hips fully to edge of bed, pt having diffiuclty wt-shifting in sitting. Min A to get legs back into bed to return to supine  Transfers Overall transfer level: Needs assistance Equipment used: Rolling walker (2 wheeled) Transfers: Sit to/from Stand Sit to Stand: Mod assist         General transfer comment: Assist to power up. VC for hand placment.  Ambulation/Gait Ambulation/Gait assistance: Min assist Ambulation Distance (Feet): 40 Feet Assistive device: Rolling walker (2 wheeled) Gait Pattern/deviations: Step-to pattern;Decreased weight shift to right Gait velocity: decreased   General Gait Details: vc's for posture as well as putting wt through RLE   Stairs            Wheelchair Mobility    Modified Rankin (Stroke Patients Only)       Balance Overall  balance assessment: Needs assistance Sitting-balance support: No upper extremity supported;Feet supported Sitting balance-Leahy Scale: Good     Standing balance support: Bilateral upper extremity supported;During functional activity Standing balance-Leahy Scale: Poor                      Cognition Arousal/Alertness: Awake/alert Behavior During Therapy: WFL for tasks assessed/performed Overall Cognitive Status: Within Functional Limits for tasks assessed                      Exercises Total Joint Exercises Ankle Circles/Pumps: AROM;Both;10 reps;Supine Quad Sets: AROM;Both;10 reps;Seated Heel Slides: AAROM;Right;10 reps;Supine Hip ABduction/ADduction: AAROM;Right;10 reps;Supine Straight Leg Raises: AAROM;Right;10 reps;Supine Long Arc Quad: AAROM;Right;10 reps;Seated    General Comments        Pertinent Vitals/Pain Pain Assessment: 0-10 Pain Score: 8  Pain Location: right knee Pain Intervention(s): Monitored during session    Home Living Family/patient expects to be discharged to:: Private residence Living Arrangements: Spouse/significant other Available Help at Discharge: Family;Available 24 hours/day Type of Home: House Home Access: Stairs to enter Entrance Stairs-Rails: Right;Left;Can reach both Home Layout: Multi-level;Able to live on main level with bedroom/bathroom Home Equipment: Walker - 2 wheels      Prior Function Level of Independence: Independent          PT Goals (current goals can now be found in the care plan section) Acute Rehab PT Goals Patient Stated Goal: return home PT Goal Formulation: With patient Time For Goal Achievement: 12/28/13 Potential to  Achieve Goals: Good Progress towards PT goals: Progressing toward goals    Frequency  7X/week    PT Plan Current plan remains appropriate    Co-evaluation             End of Session Equipment Utilized During Treatment: Gait belt Activity Tolerance: Patient tolerated  treatment well Patient left: in bed;with call bell/phone within reach;with family/visitor present     Time: 1421-1450 PT Time Calculation (min): 29 min  Charges:  $Gait Training: 8-22 mins $Therapeutic Exercise: 8-22 mins                    G Codes:     Lyanne Co, PT  Acute Rehab Services  [phone]  Lyanne Co 12/21/2013, 3:49 PM

## 2013-12-21 NOTE — Evaluation (Signed)
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 PMH includes HTN, GERD, and OA, left THA 6 yrs ago.  Clinical Impression  Pt admitted with pain and dysfunction right knee. Pt currently with functional limitations due to the deficits listed below (see PT Problem List). Pt requiring mod A for sit to stand but ambulated 5' to chair with min A and RW.  Pt will benefit from skilled PT to increase their independence and safety with mobility to allow discharge to the venue listed below. PT will continue to follow.       Follow Up Recommendations Home health PT;Supervision - Intermittent    Equipment Recommendations  None recommended by PT    Recommendations for Other Services       Precautions / Restrictions Precautions Precautions: Knee Precaution Comments: reviewed proper positioning Required Braces or Orthoses: Knee Immobilizer - Right Restrictions Weight Bearing Restrictions: Yes RLE Weight Bearing: Weight bearing as tolerated      Mobility  Bed Mobility Overal bed mobility: Needs Assistance Bed Mobility: Supine to Sit     Supine to sit: Min assist     General bed mobility comments: vc's for sequencing, min A to get right hip fully out to EOB  Transfers Overall transfer level: Needs assistance Equipment used: Rolling walker (2 wheeled) Transfers: Sit to/from Stand Sit to Stand: Mod assist         General transfer comment: mod A for power up for stand with pt having difficulty bringing wt fwd as well as being fearful of getting up. vc's for hand placement  Ambulation/Gait Ambulation/Gait assistance: Min assist Ambulation Distance (Feet): 5 Feet Assistive device: Rolling walker (2 wheeled) Gait Pattern/deviations: Step-to pattern Gait velocity: decreased   General Gait Details: vc's  for sequencing to decrease pain  Stairs            Wheelchair Mobility    Modified Rankin (Stroke Patients Only)       Balance Overall balance assessment: Needs assistance Sitting-balance support: No upper extremity supported;Feet supported Sitting balance-Leahy Scale: Good     Standing balance support: Bilateral upper extremity supported;During functional activity Standing balance-Leahy Scale: Poor                               Pertinent Vitals/Pain Pain Assessment: 0-10 Pain Score: 10-Worst pain ever Pain Location: right knee Pain Intervention(s): RN gave pain meds during session;Monitored during session    Home Living Family/patient expects to be discharged to:: Private residence Living Arrangements: Spouse/significant other Available Help at Discharge: Family;Available 24 hours/day Type of Home: House Home Access: Stairs to enter Entrance Stairs-Rails: Right;Left;Can reach both Entrance Stairs-Number of Steps: 3 Home Layout: Multi-level;Able to live on main level with bedroom/bathroom Home Equipment: Walker - 2 wheels Additional Comments: pt reports she has "handicapped height" toilet    Prior Function Level of Independence: Independent               Hand Dominance        Extremity/Trunk Assessment   Upper Extremity Assessment: Defer to OT evaluation;Overall WFL for tasks assessed           Lower Extremity Assessment: RLE deficits/detail RLE Deficits / Details: no quad noted on eval with ROM but no knee buckling in standing with most wt through  UE's, hip flex 2/5, knee ext 0/5    Cervical / Trunk Assessment: Normal  Communication   Communication: No difficulties  Cognition Arousal/Alertness: Lethargic Behavior During Therapy: WFL for tasks assessed/performed Overall Cognitive Status: Within Functional Limits for tasks assessed                      General Comments      Exercises Total Joint Exercises Ankle  Circles/Pumps: AROM;Both;20 reps;Seated;Supine Quad Sets: AROM;Both;10 reps;Seated Goniometric ROM: -10-30 degree AROM right knee      Assessment/Plan    PT Assessment Patient needs continued PT services  PT Diagnosis Difficulty walking;Abnormality of gait;Acute pain   PT Problem List Decreased strength;Decreased range of motion;Decreased activity tolerance;Decreased balance;Decreased mobility;Decreased knowledge of use of DME;Decreased knowledge of precautions;Pain  PT Treatment Interventions DME instruction;Gait training;Stair training;Functional mobility training;Therapeutic activities;Therapeutic exercise;Balance training;Patient/family education   PT Goals (Current goals can be found in the Care Plan section) Acute Rehab PT Goals Patient Stated Goal: return home PT Goal Formulation: With patient Time For Goal Achievement: 12/28/13 Potential to Achieve Goals: Good    Frequency 7X/week   Barriers to discharge        Co-evaluation               End of Session Equipment Utilized During Treatment: Gait belt Activity Tolerance: Patient tolerated treatment well Patient left: in chair;with call bell/phone within reach;with family/visitor present Nurse Communication: Mobility status         Time: 0815-0848 PT Time Calculation (min): 33 min   Charges:   PT Evaluation $Initial PT Evaluation Tier I: 1 Procedure PT Treatments $Gait Training: 8-22 mins $Therapeutic Activity: 8-22 mins   PT G Codes:        Lyanne Co, PT  Acute Rehab Services  [phone]   Lyanne Co 12/21/2013, 8:58 AM

## 2013-12-21 NOTE — Progress Notes (Signed)
ANTICOAGULATION CONSULT NOTE - Initial Consult  Pharmacy Consult for Warfarin Indication: VTE prophylaxis  No Known Allergies  Patient Measurements: Height:  (165.1 cm) Weight: 216 lb 9 oz (98.232 kg) IBW/kg (Calculated) : 57  Vital Signs: Temp: 98.3 F (36.8 C) (09/19 0546) Temp src: Oral (09/19 0546) BP: 134/63 mmHg (09/19 0546) Pulse Rate: 72 (09/19 0546)  Labs:  Recent Labs  12/21/13 0550  HGB 11.9*  HCT 36.1  PLT 293  LABPROT 13.8  INR 1.06  CREATININE 0.64    Estimated Creatinine Clearance: 73.8 ml/min (by C-G formula based on Cr of 0.64).   Medical History: Past Medical History  Diagnosis Date  . Hypertension   . GERD (gastroesophageal reflux disease)   . Arthritis     Medications:  Prescriptions prior to admission  Medication Sig Dispense Refill  . aspirin 81 MG tablet Take 81 mg by mouth daily.      Marland Kitchen atorvastatin (LIPITOR) 40 MG tablet Take 40 mg by mouth daily.      Marland Kitchen esomeprazole (NEXIUM) 40 MG capsule Take 40 mg by mouth daily at 12 noon.      . meloxicam (MOBIC) 7.5 MG tablet Take 7.5 mg by mouth 2 (two) times daily.      . potassium chloride SA (K-DUR,KLOR-CON) 20 MEQ tablet Take 10 mEq by mouth daily.      Marland Kitchen telmisartan-hydrochlorothiazide (MICARDIS HCT) 40-12.5 MG per tablet Take 1.5 tablets by mouth daily.       Scheduled:  . aspirin  81 mg Oral Daily  . atorvastatin  40 mg Oral Daily  . coumadin book   Does not apply Once  . ferrous sulfate  325 mg Oral TID PC  . hydrochlorothiazide  12.5 mg Oral Daily  . irbesartan  150 mg Oral Daily  . pantoprazole  80 mg Oral Q1200  . potassium chloride SA  10 mEq Oral Daily  . warfarin  5 mg Oral ONCE-1800  . warfarin   Does not apply Once  . Warfarin - Pharmacist Dosing Inpatient   Does not apply q1800   Infusions:  . sodium chloride 50 mL/hr at 12/21/13 0325    Assessment: 73yo female presents with R knee end stage osteoarthritis to undergo R total knee arthroplasty. Pharmacy is  consulted to dose warfarin for post-procedure VTE prophylaxis.  She was initially started on warfarin 5 mg, INR today is 1.06.  Will continue with 5 mg, anticipating rise within the next few days.  Renal function appears well and no signs or symptoms of bleeding.    Goal of Therapy:  INR 2-3 Monitor platelets by anticoagulation protocol: Yes   Plan:  - Give warfarin  tonight - Daily INR/CBC - Continue to monitor H&H and platelets and s/sx of bleeding - Will educate patient on warfarin  Red Christians, Pharm. D. Clinical Pharmacy Resident Pager: [phone] Ph: [phone] 12/21/2013 10:41 AM

## 2013-12-21 NOTE — Progress Notes (Signed)
    Subjective: 1 Day Post-Op Procedure(s) (LRB): RIGHT TOTAL KNEE ARTHROPLASTY (Right) Patient reports pain as 6 on 0-10 scale.   Denies CP or SOB.  Voiding without difficulty. Positive flatus. Objective: Vital signs in last 24 hours: Temp:  [97.3 F (36.3 C)-98.3 F (36.8 C)] 98.3 F (36.8 C) (09/19 0546) Pulse Rate:  [68-106] 72 (09/19 0546) Resp:  [11-18] 18 (09/19 0546) BP: (133-189)/(51-80) 134/63 mmHg (09/19 0546) SpO2:  [96 %-100 %] 100 % (09/19 0546) Weight:  [98.232 kg (216 lb 9 oz)] 98.232 kg (216 lb 9 oz) (09/18 0959)  Intake/Output from previous day: 09/18 0701 - 09/19 0700 In: 2000 [I.V.:2000] Out: 1065 [Urine:1065] Intake/Output this shift:    Labs:  Recent Labs  12/21/13 0550  HGB 11.9*    Recent Labs  12/21/13 0550  WBC 12.8*  RBC 4.02  HCT 36.1  PLT 293    Recent Labs  12/21/13 0550  NA 133*  K 4.0  CL 95*  CO2 26  BUN 19  CREATININE 0.64  GLUCOSE 111*  CALCIUM 8.6    Recent Labs  12/21/13 0550  INR 1.06    Physical Exam: Neurologically intact ABD soft Incision: dressing C/D/I Compartment soft  Assessment/Plan: 1 Day Post-Op Procedure(s) (LRB): RIGHT TOTAL KNEE ARTHROPLASTY (Right) Advance diet Up with therapy Dressing change in AM  Bostyn Bogie D for Dr. Venita Lick [facility] [phone] 12/21/2013, 9:03 AM

## 2013-12-22 LAB — PROTIME-INR
INR: 1.36 (ref 0.00–1.49)
Prothrombin Time: 16.8 seconds — ABNORMAL HIGH (ref 11.6–15.2)

## 2013-12-22 LAB — CBC
HCT: 32.6 % — ABNORMAL LOW (ref 36.0–46.0)
Hemoglobin: 10.6 g/dL — ABNORMAL LOW (ref 12.0–15.0)
MCH: 28.2 pg (ref 26.0–34.0)
MCHC: 32.5 g/dL (ref 30.0–36.0)
MCV: 86.7 fL (ref 78.0–100.0)
PLATELETS: 301 10*3/uL (ref 150–400)
RBC: 3.76 MIL/uL — ABNORMAL LOW (ref 3.87–5.11)
RDW: 14.1 % (ref 11.5–15.5)
WBC: 16.3 10*3/uL — AB (ref 4.0–10.5)

## 2013-12-22 MED ORDER — POLYETHYLENE GLYCOL 3350 17 G PO PACK
17.0000 g | PACK | Freq: Every day | ORAL | Status: DC
  Filled 2013-12-22: qty 1

## 2013-12-22 MED ORDER — DOCUSATE SODIUM 50 MG PO CAPS
50.0000 mg | ORAL_CAPSULE | Freq: Every day | ORAL | Status: DC
  Administered 2013-12-22 – 2013-12-23 (×2): 50 mg via ORAL
  Filled 2013-12-22 (×2): qty 1

## 2013-12-22 MED ORDER — METOCLOPRAMIDE HCL 5 MG/ML IJ SOLN
10.0000 mg | Freq: Four times a day (QID) | INTRAMUSCULAR | Status: DC
  Administered 2013-12-23: 10 mg via INTRAVENOUS
  Filled 2013-12-22 (×5): qty 2

## 2013-12-22 MED ORDER — WARFARIN SODIUM 5 MG PO TABS
5.0000 mg | ORAL_TABLET | Freq: Once | ORAL | Status: AC
  Administered 2013-12-22: 5 mg via ORAL
  Filled 2013-12-22: qty 1

## 2013-12-22 NOTE — Progress Notes (Signed)
Physical Therapy Treatment Patient Details Name: Sheri Mccoy MRN: [id number] DOB: 1940-12-09 Today's Date: 2013/12/24    History of Present Illness Pt presents for right TKA after failed conservative mgmt of right knee OA. PMH includes HTN, GERD, and OA, left THA 6 yrs ago.    PT Comments    Pt making steady progress toward goals. Exercise only this pm as pt just got back in bed with nursing and is tired, still slightly nauseated.  Follow Up Recommendations  Home health PT;Supervision - Intermittent     Equipment Recommendations  None recommended by PT       Precautions / Restrictions Precautions Precautions: Knee Precaution Comments: reviewed proper positioning Required Braces or Orthoses: Knee Immobilizer - Right Knee Immobilizer - Right: On when out of bed or walking Restrictions RLE Weight Bearing: Weight bearing as tolerated          Cognition Arousal/Alertness: Awake/alert Behavior During Therapy: WFL for tasks assessed/performed Overall Cognitive Status: Within Functional Limits for tasks assessed                      Exercises Total Joint Exercises Ankle Circles/Pumps: AROM;Both;10 reps;Supine Quad Sets: AROM;Strengthening;Right;10 reps;Supine Heel Slides: AAROM;Strengthening;Right;10 reps;Supine Hip ABduction/ADduction: AAROM;Strengthening;Right;10 reps;Supine Straight Leg Raises: AAROM;Strengthening;Right;10 reps;Supine        Pertinent Vitals/Pain Pain Assessment: 0-10 Pain Score: 5  Pain Location: right knee Pain Descriptors / Indicators: Aching;Sore Pain Intervention(s): Monitored during session;Premedicated before session     PT Goals (current goals can now be found in the care plan section) Acute Rehab PT Goals Patient Stated Goal: return home PT Goal Formulation: With patient Time For Goal Achievement: 12/28/13 Potential to Achieve Goals: Good Progress towards PT goals: Progressing toward goals    Frequency  7X/week    PT  Plan Current plan remains appropriate       End of Session Equipment Utilized During Treatment: Gait belt Activity Tolerance: Patient tolerated treatment well Patient left: in bed;with call bell/phone within reach;with family/visitor present     Time: 9629-5284 PT Time Calculation (min): 12 min  Charges:  $Gait Training: 8-22 mins $Therapeutic Exercise: 8-22 mins                    G Codes:      Sallyanne Kuster Dec 24, 2013, 2:24 PM  Sallyanne Kuster, PTA Office- [phone]

## 2013-12-22 NOTE — Progress Notes (Signed)
Subjective: 2 Days Post-Op Procedure(s) (LRB): RIGHT TOTAL KNEE ARTHROPLASTY (Right) Patient reports pain as moderate.  Reports nausea. Tolerating Liquids, but not solids. Passing flatus, no BM. Progressing with PT. Denies Sob, CP, HA.  Objective: Vital signs in last 24 hours: Temp:  [97.5 F (36.4 C)-98.6 F (37 C)] 97.5 F (36.4 C) (09/20 0603) Pulse Rate:  [92-96] 92 (09/20 0603) Resp:  [18] 18 (09/20 0603) BP: (110-138)/(54-61) 110/54 mmHg (09/20 0603) SpO2:  [92 %-100 %] 95 % (09/20 0603) FiO2 (%):  [2 %] 2 % (09/19 1233)  Intake/Output from previous day: 09/19 0701 - 09/20 0700 In: 2029.2 [P.O.:720; I.V.:1309.2] Out: -  Intake/Output this shift: Total I/O In: 120 [P.O.:120] Out: -    Recent Labs  12/21/13 0550 12/22/13 0435  HGB 11.9* 10.6*    Recent Labs  12/21/13 0550 12/22/13 0435  WBC 12.8* 16.3*  RBC 4.02 3.76*  HCT 36.1 32.6*  PLT 293 301    Recent Labs  12/21/13 0550  NA 133*  K 4.0  CL 95*  CO2 26  BUN 19  CREATININE 0.64  GLUCOSE 111*  CALCIUM 8.6    Recent Labs  12/21/13 0550 12/22/13 0435  INR 1.06 1.36    A&O x3. Dressing changed today. RLE N/V intact. No Lower leg edema. Edema around knee. No erythema. Calf slightly tender, will monitor. VSS.  Assessment/Plan: 2 Days Post-Op Procedure(s) (LRB): RIGHT TOTAL KNEE ARTHROPLASTY (Right) Up with PT Dressing changed Plan D/c at the first of the week.  Continue current care Coumadin per pharmacy   Arsenio Loader L 12/22/2013, 10:14 AM

## 2013-12-22 NOTE — Progress Notes (Signed)
Physical Therapy Treatment Patient Details Name: Sheri Mccoy MRN: [id number] DOB: 1940-12-19 Today's Date: December 29, 2013    History of Present Illness Pt presents for right TKA after failed conservative mgmt of right knee OA. PMH includes HTN, GERD, and OA, left THA 6 yrs ago.    PT Comments    Pt making steady progress toward mobility goals. Limited this am by nausea (was medicated by RN for nausea prior to session).  Follow Up Recommendations  Home health PT;Supervision - Intermittent     Equipment Recommendations  None recommended by PT       Precautions / Restrictions Precautions Precautions: Knee Required Braces or Orthoses: Knee Immobilizer - Right Knee Immobilizer - Right: On when out of bed or walking Restrictions RLE Weight Bearing: Weight bearing as tolerated    Mobility  Bed Mobility               General bed mobility comments: not assessed-pt in chair before and after session  Transfers Overall transfer level: Needs assistance Equipment used: Rolling walker (2 wheeled) Transfers: Sit to/from Stand Sit to Stand: Min assist;Min guard         General transfer comment: assist to power up to standing, min guard assist to sit down with controlled descent. cues on hand placement needed.  Ambulation/Gait Ambulation/Gait assistance: Min guard Ambulation Distance (Feet): 30 Feet Assistive device: Rolling walker (2 wheeled) Gait Pattern/deviations: Step-to pattern;Antalgic Gait velocity: decreased Gait velocity interpretation: Below normal speed for age/gender General Gait Details: cues on posture and walker position with gait. limited distance due to nausea.   Stairs            Wheelchair Mobility    Modified Rankin (Stroke Patients Only)       Cognition Arousal/Alertness: Awake/alert Behavior During Therapy: WFL for tasks assessed/performed Overall Cognitive Status: Within Functional Limits for tasks assessed                       Exercises Total Joint Exercises Ankle Circles/Pumps: AROM;Both;10 reps;Seated Quad Sets: AROM;Strengthening;Right;10 reps;Seated Heel Slides: AAROM;Strengthening;Right;10 reps;Seated Hip ABduction/ADduction: AAROM;Strengthening;Right;10 reps;Seated Straight Leg Raises: AAROM;Strengthening;Right;10 reps;Seated Goniometric ROM: 5-50 degrees flexion AAROM right knee        Pertinent Vitals/Pain Pain Assessment: 0-10 Pain Score: 6  Pain Location: right knee Pain Descriptors / Indicators: Sore;Aching Pain Intervention(s): Monitored during session;Premedicated before session;Repositioned           PT Goals (current goals can now be found in the care plan section) Acute Rehab PT Goals Patient Stated Goal: return home PT Goal Formulation: With patient Time For Goal Achievement: 12/28/13 Potential to Achieve Goals: Good Progress towards PT goals: Progressing toward goals    Frequency  7X/week    PT Plan Current plan remains appropriate       End of Session Equipment Utilized During Treatment: Gait belt Activity Tolerance: Patient tolerated treatment well;Treatment limited secondary to medical complications (Comment) Patient left: in chair;with call bell/phone within reach;with family/visitor present     Time: 2956-2130 PT Time Calculation (min): 30 min  Charges:  $Gait Training: 8-22 mins $Therapeutic Exercise: 8-22 mins                    G Codes:      Sallyanne Kuster 12/29/2013, 12:45 PM  Sallyanne Kuster, PTA Office- [phone]

## 2013-12-22 NOTE — Progress Notes (Signed)
Nausea unrelieved by PRN Zofran and Reglan. Bowel sounds hypoactive and patient states no flatus since AM. Stomach tender and distended.  MD made aware. Orders placed for scheduled miralax and 10 mg Reglan q 6 hours. Will continue to monitor.

## 2013-12-22 NOTE — Progress Notes (Signed)
ANTICOAGULATION CONSULT NOTE - Initial Consult  Pharmacy Consult for Warfarin Indication: VTE prophylaxis  No Known Allergies  Patient Measurements: Height:  (165.1 cm) Weight: 216 lb 9 oz (98.232 kg) IBW/kg (Calculated) : 57  Vital Signs: Temp: 97.5 F (36.4 C) (09/20 0603) Temp src: Oral (09/20 0603) BP: 110/54 mmHg (09/20 0603) Pulse Rate: 92 (09/20 0603)  Labs:  Recent Labs  12/21/13 0550 12/22/13 0435  HGB 11.9* 10.6*  HCT 36.1 32.6*  PLT 293 301  LABPROT 13.8 16.8*  INR 1.06 1.36  CREATININE 0.64  --     Estimated Creatinine Clearance: 73.8 ml/min (by C-G formula based on Cr of 0.64).   Medical History: Past Medical History  Diagnosis Date  . Hypertension   . GERD (gastroesophageal reflux disease)   . Arthritis     Medications:  Prescriptions prior to admission  Medication Sig Dispense Refill  . aspirin 81 MG tablet Take 81 mg by mouth daily.      Marland Kitchen atorvastatin (LIPITOR) 40 MG tablet Take 40 mg by mouth daily.      Marland Kitchen esomeprazole (NEXIUM) 40 MG capsule Take 40 mg by mouth daily at 12 noon.      . meloxicam (MOBIC) 7.5 MG tablet Take 7.5 mg by mouth 2 (two) times daily.      . potassium chloride SA (K-DUR,KLOR-CON) 20 MEQ tablet Take 10 mEq by mouth daily.      Marland Kitchen telmisartan-hydrochlorothiazide (MICARDIS HCT) 40-12.5 MG per tablet Take 1.5 tablets by mouth daily.       Scheduled:  . aspirin  81 mg Oral Daily  . atorvastatin  40 mg Oral Daily  . coumadin book   Does not apply Once  . ferrous sulfate  325 mg Oral TID PC  . hydrochlorothiazide  12.5 mg Oral Daily  . irbesartan  150 mg Oral Daily  . pantoprazole  80 mg Oral Q1200  . potassium chloride SA  10 mEq Oral Daily  . warfarin   Does not apply Once  . Warfarin - Pharmacist Dosing Inpatient   Does not apply q1800   Infusions:  . sodium chloride 30 mL/hr at 12/22/13 0600    Assessment: 73yo female presents with R knee end stage osteoarthritis to undergo R total knee arthroplasty.  Pharmacy is consulted to dose warfarin for post-procedure VTE prophylaxis.  She was initially started on warfarin 5 mg, INR today is 1.36.  I anticipate the INR will continue to rise steadily since this is only her third day.  No signs or symptoms of bleeding.  H/H are slightly low but stable.    Goal of Therapy:  INR 2-3   Plan:  - Give warfarin 5 mg x1 tonight - Daily INR/CBC - Continue to monitor H&H and platelets and s/sx of bleeding - Will educate patient on warfarin  Red Christians, Pharm. D. Clinical Pharmacy Resident Pager: [phone] Ph: [phone] 12/22/2013 10:41 AM

## 2013-12-23 ENCOUNTER — Encounter (HOSPITAL_COMMUNITY): Payer: Self-pay | Admitting: Orthopedic Surgery

## 2013-12-23 LAB — CBC
HEMATOCRIT: 31.4 % — AB (ref 36.0–46.0)
Hemoglobin: 10.3 g/dL — ABNORMAL LOW (ref 12.0–15.0)
MCH: 28.3 pg (ref 26.0–34.0)
MCHC: 32.8 g/dL (ref 30.0–36.0)
MCV: 86.3 fL (ref 78.0–100.0)
PLATELETS: 337 10*3/uL (ref 150–400)
RBC: 3.64 MIL/uL — AB (ref 3.87–5.11)
RDW: 14.1 % (ref 11.5–15.5)
WBC: 19.7 10*3/uL — ABNORMAL HIGH (ref 4.0–10.5)

## 2013-12-23 LAB — PROTIME-INR
INR: 1.84 — ABNORMAL HIGH (ref 0.00–1.49)
Prothrombin Time: 21.3 seconds — ABNORMAL HIGH (ref 11.6–15.2)

## 2013-12-23 MED ORDER — ONDANSETRON HCL 4 MG PO TABS: 4.0000 mg | ORAL_TABLET | Freq: Three times a day (TID) | ORAL | Status: DC | PRN

## 2013-12-23 NOTE — Progress Notes (Signed)
Physical Therapy Treatment Patient Details Name: Sheri Mccoy MRN: [id number] DOB: [birth year] Today's Date: 12/23/2013    History of Present Illness Pt presents for right TKA after failed conservative mgmt of right knee OA. PMH includes HTN, GERD, and OA, left THA 6 yrs ago.    PT Comments    Patient able to complete stair training this morning. Patient was limited with ambulation due to nausea. Patient is hoping to DC later today but will need to work and feel confident with ambulation prior to DC home.   Follow Up Recommendations  Home health PT;Supervision - Intermittent     Equipment Recommendations  None recommended by PT    Recommendations for Other Services       Precautions / Restrictions Precautions Precautions: Knee Required Braces or Orthoses: Knee Immobilizer - Right Knee Immobilizer - Right: On when out of bed or walking Restrictions RLE Weight Bearing: Weight bearing as tolerated    Mobility  Bed Mobility               General bed mobility comments: not tested  Transfers Overall transfer level: Needs assistance Equipment used: Rolling walker (2 wheeled)   Sit to Stand: Min guard         General transfer comment: Cues for hand placement  Ambulation/Gait Ambulation/Gait assistance: Min guard Ambulation Distance (Feet): 50 Feet Assistive device: Rolling walker (2 wheeled) Gait Pattern/deviations: Step-through pattern;Decreased stride length Gait velocity: decreased   General Gait Details: cues on posture and walker position with gait. limited distance due to nausea.   Stairs Stairs: Yes Stairs assistance: Min guard Stair Management: Step to pattern;Forwards;Two rails Number of Stairs: 5 General stair comments: patient able to complete stair training with cues for sequency  Wheelchair Mobility    Modified Rankin (Stroke Patients Only)       Balance                                    Cognition  Arousal/Alertness: Awake/alert Behavior During Therapy: WFL for tasks assessed/performed Overall Cognitive Status: Within Functional Limits for tasks assessed                      Exercises Total Joint Exercises Quad Sets: AROM;Strengthening;Right;10 reps;Supine Heel Slides: AAROM;Strengthening;Right;10 reps Hip ABduction/ADduction: AAROM;Strengthening;Right;10 reps Straight Leg Raises: AAROM;Strengthening;Right;10 reps    General Comments        Pertinent Vitals/Pain Pain Score: 5  Pain Location: R Knee Pain Descriptors / Indicators: Aching;Sore Pain Intervention(s): Monitored during session;Limited activity within patient's tolerance    Home Living                      Prior Function            PT Goals (current goals can now be found in the care plan section) Progress towards PT goals: Progressing toward goals    Frequency  7X/week    PT Plan Current plan remains appropriate    Co-evaluation             End of Session Equipment Utilized During Treatment: Gait belt Activity Tolerance: Other (comment) (patient limited by nausea) Patient left: in chair;with call bell/phone within reach;with family/visitor present     Time: 7253-6644 PT Time Calculation (min): 15 min  Charges:  $Gait Training: 8-22 mins  G Codes:      Fredrich Birks 12/23/2013, 9:28 AM 12/23/2013 Fredrich Birks PTA [phone] pager [phone] office

## 2013-12-23 NOTE — Progress Notes (Signed)
D/C instructions and scripts given to Pt and her husband. D/C instructions reviewed and Pt felt comfortable with discharge and her plan of care. Pts husband a the bedside to take Pt home at this time.

## 2013-12-23 NOTE — Discharge Summary (Signed)
Physician Discharge Summary   Patient ID: Sheri Mccoy MRN: [id number] DOB/AGE: [birth year] 73 y.o.  Admit date: 12/20/2013 Discharge date: 12/23/2013  Admission Diagnoses:  Active Problems:   Osteoarthrosis, unspecified whether generalized or localized, lower leg   Discharge Diagnoses:  Same   Surgeries: Procedure(s): RIGHT TOTAL KNEE ARTHROPLASTY on 12/20/2013   Consultants: Pt/OT  Discharged Condition: Stable  Hospital Course: Sheri Mccoy is an 73 y.o. female who was admitted 12/20/2013 with a chief complaint of No chief complaint on file. , and found to have a diagnosis of <principal problem not specified>.  They were brought to the operating room on 12/20/2013 and underwent the above named procedures.    The patient had an uncomplicated hospital course and was stable for discharge.  Recent vital signs:  Filed Vitals:   12/23/13 0800  BP:   Pulse:   Temp:   Resp: 18    Recent laboratory studies:  Results for orders placed during the hospital encounter of 12/20/13  PROTIME-INR      Result Value Ref Range   Prothrombin Time 13.8  11.6 - 15.2 seconds   INR 1.06  0.00 - 1.49  CBC      Result Value Ref Range   WBC 12.8 (*) 4.0 - 10.5 K/uL   RBC 4.02  3.87 - 5.11 MIL/uL   Hemoglobin 11.9 (*) 12.0 - 15.0 g/dL   HCT 95.2  84.1 - 32.4 %   MCV 89.8  78.0 - 100.0 fL   MCH 29.6  26.0 - 34.0 pg   MCHC 33.0  30.0 - 36.0 g/dL   RDW 40.1  02.7 - 25.3 %   Platelets 293  150 - 400 K/uL  BASIC METABOLIC PANEL      Result Value Ref Range   Sodium 133 (*) 137 - 147 mEq/L   Potassium 4.0  3.7 - 5.3 mEq/L   Chloride 95 (*) 96 - 112 mEq/L   CO2 26  19 - 32 mEq/L   Glucose, Bld 111 (*) 70 - 99 mg/dL   BUN 19  6 - 23 mg/dL   Creatinine, Ser 6.64  0.50 - 1.10 mg/dL   Calcium 8.6  8.4 - 40.3 mg/dL   GFR calc non Af Amer 87 (*) >90 mL/min   GFR calc Af Amer >90  >90 mL/min   Anion gap 12  5 - 15  PROTIME-INR      Result Value Ref Range   Prothrombin Time 16.8 (*) 11.6 -  15.2 seconds   INR 1.36  0.00 - 1.49  CBC      Result Value Ref Range   WBC 16.3 (*) 4.0 - 10.5 K/uL   RBC 3.76 (*) 3.87 - 5.11 MIL/uL   Hemoglobin 10.6 (*) 12.0 - 15.0 g/dL   HCT 47.4 (*) 25.9 - 56.3 %   MCV 86.7  78.0 - 100.0 fL   MCH 28.2  26.0 - 34.0 pg   MCHC 32.5  30.0 - 36.0 g/dL   RDW 87.5  64.3 - 32.9 %   Platelets 301  150 - 400 K/uL  PROTIME-INR      Result Value Ref Range   Prothrombin Time 21.3 (*) 11.6 - 15.2 seconds   INR 1.84 (*) 0.00 - 1.49  CBC      Result Value Ref Range   WBC 19.7 (*) 4.0 - 10.5 K/uL   RBC 3.64 (*) 3.87 - 5.11 MIL/uL   Hemoglobin 10.3 (*) 12.0 - 15.0 g/dL  HCT 31.4 (*) 36.0 - 46.0 %   MCV 86.3  78.0 - 100.0 fL   MCH 28.3  26.0 - 34.0 pg   MCHC 32.8  30.0 - 36.0 g/dL   RDW 16.1  09.6 - 04.5 %   Platelets 337  150 - 400 K/uL    Discharge Medications:     Medication List         aspirin 81 MG tablet  Take 81 mg by mouth daily.     atorvastatin 40 MG tablet  Commonly known as:  LIPITOR  Take 40 mg by mouth daily.     esomeprazole 40 MG capsule  Commonly known as:  NEXIUM  Take 40 mg by mouth daily at 12 noon.     meloxicam 7.5 MG tablet  Commonly known as:  MOBIC  Take 7.5 mg by mouth 2 (two) times daily.     methocarbamol 500 MG tablet  Commonly known as:  ROBAXIN  Take 1 tablet (500 mg total) by mouth 3 (three) times daily as needed.     oxyCODONE-acetaminophen 5-325 MG per tablet  Commonly known as:  ROXICET  Take 1-2 tablets by mouth every 4 (four) hours as needed for severe pain.     potassium chloride SA 20 MEQ tablet  Commonly known as:  K-DUR,KLOR-CON  Take 10 mEq by mouth daily.     telmisartan-hydrochlorothiazide 40-12.5 MG per tablet  Commonly known as:  MICARDIS HCT  Take 1.5 tablets by mouth daily.     warfarin 5 MG tablet  Commonly known as:  COUMADIN  Take 1 tablet (5 mg total) by mouth daily.        Diagnostic Studies: Dg Chest 2 View  12/12/2013   CLINICAL DATA:  Preop for knee replacement   EXAM: CHEST  2 VIEW  COMPARISON:  Chest x-ray of 11/24/2006  FINDINGS: No active infiltrate or effusion is seen. Mediastinal and hilar contours are unremarkable. The heart is mildly enlarged and stable. Only a small hiatal hernia is currently seen. No acute bony abnormality is noted.  IMPRESSION: 1. No active cardiopulmonary disease. 2. Small hiatal hernia.   Electronically Signed   By: Dwyane Dee M.D.   On: 12/12/2013 11:25   Dg Knee Right Port  12/20/2013   CLINICAL DATA:  Total right knee replacement  EXAM: PORTABLE RIGHT KNEE - 1-2 VIEW  COMPARISON:  None.  FINDINGS: The right knee demonstrates a total knee arthroplasty without evidence of hardware failure complication. There is no significant joint effusion. There is no fracture or dislocation. The alignment is anatomic. Post-surgical changes noted in the surrounding soft tissues.  IMPRESSION: Total right knee arthroplasty.   Electronically Signed   By: Elige Ko   On: 12/20/2013 15:08    Disposition:         Follow-up Information   Follow up with NORRIS,STEVEN R, MD. Call in 2 weeks. [phone])    Specialty:  Orthopedic Surgery   Contact information:   [address] [place] [address] [phone]       Follow up with [facility]. (They will contact you to schedule home physical therapy visits.)    Contact information:   [address] [place] [address] [phone]        Signed: Thea Gist 12/23/2013, 9:58 AM

## 2013-12-23 NOTE — Progress Notes (Signed)
Physical Therapy Treatment Patient Details Name: Sheri Mccoy MRN: [id number] DOB: 1941-01-29 Today's Date: 12/23/2013    History of Present Illness Pt presents for right TKA after failed conservative mgmt of right knee OA. PMH includes HTN, GERD, and OA, left THA 6 yrs ago.    PT Comments    Patient able to complete hall ambulation this afternoon. Patient safe to D/C from a mobility standpoint based on progression towards goals set on PT eval.    Follow Up Recommendations  Home health PT;Supervision - Intermittent     Equipment Recommendations  None recommended by PT    Recommendations for Other Services       Precautions / Restrictions Precautions Precautions: Knee Precaution Comments: reviewed proper positioning Required Braces or Orthoses: Knee Immobilizer - Right Knee Immobilizer - Right: On when out of bed or walking Restrictions Weight Bearing Restrictions: No RLE Weight Bearing: Weight bearing as tolerated    Mobility  Bed Mobility Overal bed mobility: Needs Assistance Bed Mobility: Supine to Sit     Supine to sit: Min assist     General bed mobility comments: not tested  Transfers Overall transfer level: Needs assistance Equipment used: Rolling walker (2 wheeled) Transfers: Sit to/from Stand Sit to Stand: Supervision         General transfer comment: Cues for safe hand placement  Ambulation/Gait Ambulation/Gait assistance: Supervision Ambulation Distance (Feet): 120 Feet Assistive device: Rolling walker (2 wheeled) Gait Pattern/deviations: Step-through pattern;Decreased stride length Gait velocity: decreased   General Gait Details: cues on posture   Stairs            Wheelchair Mobility    Modified Rankin (Stroke Patients Only)       Balance                                    Cognition Arousal/Alertness: Awake/alert Behavior During Therapy: WFL for tasks assessed/performed Overall Cognitive Status: Within  Functional Limits for tasks assessed                      Exercises      General Comments        Pertinent Vitals/Pain Pain Assessment: No/denies pain    Home Living                      Prior Function            PT Goals (current goals can now be found in the care plan section) Progress towards PT goals: Progressing toward goals    Frequency  7X/week    PT Plan Current plan remains appropriate    Co-evaluation             End of Session Equipment Utilized During Treatment: Gait belt Activity Tolerance: Patient tolerated treatment well Patient left: in chair;with call bell/phone within reach;with family/visitor present     Time: 4098-1191 PT Time Calculation (min): 11 min  Charges:  $Gait Training: 8-22 mins                    G Codes:      Fredrich Birks 12/23/2013, 1:29 PM 12/23/2013 Fredrich Birks PTA [phone] pager [phone] office

## 2013-12-23 NOTE — Progress Notes (Signed)
   Subjective: 3 Days Post-Op Procedure(s) (LRB): RIGHT TOTAL KNEE ARTHROPLASTY (Right)  Pt c/o continued mild to moderate nausea Would like to go home Therapy going well although she feels tired Patient reports pain as mild.  Objective:   VITALS:   Filed Vitals:   12/23/13 0800  BP:   Pulse:   Temp:   Resp: 18    Right knee incision healing well  nv intact distally No rashes or edema  LABS  Recent Labs  12/21/13 0550 12/22/13 0435 12/23/13 0620  HGB 11.9* 10.6* 10.3*  HCT 36.1 32.6* 31.4*  WBC 12.8* 16.3* 19.7*  PLT 293 301 337     Recent Labs  12/21/13 0550  NA 133*  K 4.0  BUN 19  CREATININE 0.64  GLUCOSE 111*     Assessment/Plan: 3 Days Post-Op Procedure(s) (LRB): RIGHT TOTAL KNEE ARTHROPLASTY (Right) zofran for nausea at home D/c home today with home health F/u in 2 weeks  Discharge home with home health   Alphonsa Overall, MPAS, PA-C  12/23/2013, 9:57 AM

## 2013-12-23 NOTE — Plan of Care (Signed)
Problem: Consults Goal: Diagnosis- Total Joint Replacement Primary Total Knee Right     

## 2013-12-23 NOTE — Progress Notes (Signed)
Occupational Therapy Treatment and Discharge Patient Details Name: Sheri Mccoy MRN: [id number] DOB: [birth year] Today's Date: 12/23/2013    History of present illness Pt presents for right TKA after failed conservative mgmt of right knee OA. PMH includes HTN, GERD, and OA, left THA 6 yrs ago.   OT comments  This 73 yo female seen today with focus on tub transfer and LBD. All education completed, we will D/C from acute OT.  Follow Up Recommendations  No OT follow up;Supervision/Assistance - 24 hour    Equipment Recommendations  3 in 1 bedside comode       Precautions / Restrictions Precautions Precautions: Knee Required Braces or Orthoses: Knee Immobilizer - Right Knee Immobilizer - Right: On when out of bed or walking Restrictions Weight Bearing Restrictions: No RLE Weight Bearing: Weight bearing as tolerated       Mobility Bed Mobility Overal bed mobility: Needs Assistance Bed Mobility: Supine to Sit     Supine to sit: Min assist     General bed mobility comments: not tested  Transfers Overall transfer level: Needs assistance Equipment used: Rolling walker (2 wheeled) Transfers: Sit to/from Stand Sit to Stand: Min guard         General transfer comment: Cues for safe hand placement        ADL Overall ADL's : Needs assistance/impaired                                 Tub/ Shower Transfer: Tub transfer;Minimal assistance;Ambulation;Rolling walker;3 in 1 Tub/Shower Transfer Details (indicate cue type and reason): Pt with needed VCs to step in and out with good knee bent backwards (not hip forwards)   General ADL Comments: Pt and husband report that husband will A her with LBADLs until she can do them by herself. She and husband were educated on putting RLE in first when getting dressed and the sequence of dressing that is most energy efficient                Cognition   Behavior During Therapy: WFL for tasks assessed/performed Overall  Cognitive Status: Within Functional Limits for tasks assessed                                    Pertinent Vitals/ Pain       Pain Assessment: 0-10 Pain Score: 5  Pain Location: R Knee Pain Descriptors / Indicators: Aching;Sore Pain Intervention(s): Monitored during session;Limited activity within patient's tolerance         Frequency Min 2X/week     Progress Toward Goals  OT Goals(current goals can now be found in the care plan section)  Progress towards OT goals:  (All education completed)     Plan Discharge plan remains appropriate       End of Session Equipment Utilized During Treatment: Gait belt;Rolling walker   Activity Tolerance Patient limited by fatigue   Patient Left in chair;with call bell/phone within reach;with family/visitor present;with nursing/sitter in room   Nurse Communication  (please offer the patient and ensure later when she is not feeling nauseated due to pt has not been eating much since sx and she says she feels weak)        Time: 8295-6213 OT Time Calculation (min): 27 min  Charges: OT General Charges $OT Visit: 1 Procedure OT Treatments $Self Care/Home Management : 23-37  mins  Evette Georges [phone] 12/23/2013, 10:42 AM

## 2013-12-24 NOTE — Anesthesia Postprocedure Evaluation (Signed)
  Anesthesia Post-op Note  Patient: Sheri Mccoy  Procedure(s) Performed: Procedure(s): RIGHT TOTAL KNEE ARTHROPLASTY (Right)  Patient Location: PACU  Anesthesia Type:GA combined with regional for post-op pain  Level of Consciousness: awake, alert , oriented and patient cooperative  Airway and Oxygen Therapy: Patient Spontanous Breathing and Patient connected to nasal cannula oxygen  Post-op Pain: mild  Post-op Assessment: Post-op Vital signs reviewed, Patient's Cardiovascular Status Stable, Respiratory Function Stable, Patent Airway, No signs of Nausea or vomiting and Pain level controlled  Post-op Vital Signs: Reviewed and stable  Last Vitals:  Filed Vitals:   12/23/13 1200  BP:   Pulse:   Temp:   Resp: 18    Complications: No apparent anesthesia complications (delayed entry, pt eval in PACU post op)

## 2014-02-01 ENCOUNTER — Encounter (HOSPITAL_COMMUNITY): Payer: Self-pay | Admitting: Emergency Medicine

## 2014-02-01 ENCOUNTER — Emergency Department (HOSPITAL_COMMUNITY): Payer: Medicare Other

## 2014-02-01 ENCOUNTER — Inpatient Hospital Stay (HOSPITAL_COMMUNITY)
Admission: EM | Admit: 2014-02-01 | Discharge: 2014-02-11 | DRG: 392 | Disposition: A | Discharge status: Home / Self Care | Payer: Medicare Other | Attending: Internal Medicine | Admitting: Internal Medicine

## 2014-02-01 DIAGNOSIS — I878 Other specified disorders of veins: Secondary | ICD-10-CM | POA: Diagnosis present

## 2014-02-01 DIAGNOSIS — K560 Paralytic ileus: Secondary | ICD-10-CM | POA: Diagnosis present

## 2014-02-01 DIAGNOSIS — Z9049 Acquired absence of other specified parts of digestive tract: Secondary | ICD-10-CM | POA: Diagnosis present

## 2014-02-01 DIAGNOSIS — Z7982 Long term (current) use of aspirin: Secondary | ICD-10-CM

## 2014-02-01 DIAGNOSIS — Z96642 Presence of left artificial hip joint: Secondary | ICD-10-CM | POA: Diagnosis present

## 2014-02-01 DIAGNOSIS — R109 Unspecified abdominal pain: Secondary | ICD-10-CM

## 2014-02-01 DIAGNOSIS — Z96651 Presence of right artificial knee joint: Secondary | ICD-10-CM | POA: Diagnosis present

## 2014-02-01 DIAGNOSIS — I10 Essential (primary) hypertension: Secondary | ICD-10-CM | POA: Diagnosis present

## 2014-02-01 DIAGNOSIS — E876 Hypokalemia: Secondary | ICD-10-CM | POA: Diagnosis present

## 2014-02-01 DIAGNOSIS — E871 Hypo-osmolality and hyponatremia: Secondary | ICD-10-CM | POA: Diagnosis present

## 2014-02-01 DIAGNOSIS — E785 Hyperlipidemia, unspecified: Secondary | ICD-10-CM | POA: Diagnosis present

## 2014-02-01 DIAGNOSIS — R112 Nausea with vomiting, unspecified: Secondary | ICD-10-CM

## 2014-02-01 DIAGNOSIS — N179 Acute kidney failure, unspecified: Secondary | ICD-10-CM | POA: Diagnosis present

## 2014-02-01 DIAGNOSIS — L0390 Cellulitis, unspecified: Secondary | ICD-10-CM | POA: Diagnosis present

## 2014-02-01 DIAGNOSIS — L03115 Cellulitis of right lower limb: Secondary | ICD-10-CM | POA: Diagnosis present

## 2014-02-01 DIAGNOSIS — K219 Gastro-esophageal reflux disease without esophagitis: Secondary | ICD-10-CM | POA: Diagnosis present

## 2014-02-01 DIAGNOSIS — R05 Cough: Secondary | ICD-10-CM

## 2014-02-01 DIAGNOSIS — K449 Diaphragmatic hernia without obstruction or gangrene: Principal | ICD-10-CM | POA: Diagnosis present

## 2014-02-01 DIAGNOSIS — R1110 Vomiting, unspecified: Secondary | ICD-10-CM

## 2014-02-01 DIAGNOSIS — L03116 Cellulitis of left lower limb: Secondary | ICD-10-CM

## 2014-02-01 DIAGNOSIS — R059 Cough, unspecified: Secondary | ICD-10-CM

## 2014-02-01 DIAGNOSIS — R110 Nausea: Secondary | ICD-10-CM | POA: Diagnosis present

## 2014-02-01 DIAGNOSIS — K3184 Gastroparesis: Secondary | ICD-10-CM | POA: Diagnosis present

## 2014-02-01 DIAGNOSIS — R1012 Left upper quadrant pain: Secondary | ICD-10-CM

## 2014-02-01 LAB — URINALYSIS, ROUTINE W REFLEX MICROSCOPIC
Glucose, UA: NEGATIVE mg/dL
Hgb urine dipstick: NEGATIVE
KETONES UR: 15 mg/dL — AB
LEUKOCYTES UA: NEGATIVE
NITRITE: NEGATIVE
PROTEIN: 100 mg/dL — AB
Specific Gravity, Urine: 1.023 (ref 1.005–1.030)
UROBILINOGEN UA: 1 mg/dL (ref 0.0–1.0)
pH: 6 (ref 5.0–8.0)

## 2014-02-01 LAB — CBC WITH DIFFERENTIAL/PLATELET
Basophils Absolute: 0 10*3/uL (ref 0.0–0.1)
Basophils Relative: 0 % (ref 0–1)
Eosinophils Absolute: 0 10*3/uL (ref 0.0–0.7)
Eosinophils Relative: 0 % (ref 0–5)
HEMATOCRIT: 35.9 % — AB (ref 36.0–46.0)
HEMOGLOBIN: 11.6 g/dL — AB (ref 12.0–15.0)
LYMPHS ABS: 0.4 10*3/uL — AB (ref 0.7–4.0)
Lymphocytes Relative: 4 % — ABNORMAL LOW (ref 12–46)
MCH: 28.3 pg (ref 26.0–34.0)
MCHC: 32.3 g/dL (ref 30.0–36.0)
MCV: 87.6 fL (ref 78.0–100.0)
MONOS PCT: 2 % — AB (ref 3–12)
Monocytes Absolute: 0.2 10*3/uL (ref 0.1–1.0)
NEUTROS ABS: 9.3 10*3/uL — AB (ref 1.7–7.7)
Neutrophils Relative %: 94 % — ABNORMAL HIGH (ref 43–77)
Platelets: 274 10*3/uL (ref 150–400)
RBC: 4.1 MIL/uL (ref 3.87–5.11)
RDW: 14.1 % (ref 11.5–15.5)
WBC: 9.9 10*3/uL (ref 4.0–10.5)

## 2014-02-01 LAB — URINE MICROSCOPIC-ADD ON

## 2014-02-01 LAB — COMPREHENSIVE METABOLIC PANEL
ALK PHOS: 132 U/L — AB (ref 39–117)
ALT: 28 U/L (ref 0–35)
ANION GAP: 16 — AB (ref 5–15)
AST: 25 U/L (ref 0–37)
Albumin: 3 g/dL — ABNORMAL LOW (ref 3.5–5.2)
BILIRUBIN TOTAL: 0.3 mg/dL (ref 0.3–1.2)
BUN: 20 mg/dL (ref 6–23)
CHLORIDE: 90 meq/L — AB (ref 96–112)
CO2: 25 meq/L (ref 19–32)
CREATININE: 0.64 mg/dL (ref 0.50–1.10)
Calcium: 8.7 mg/dL (ref 8.4–10.5)
GFR, EST NON AFRICAN AMERICAN: 87 mL/min — AB (ref >90)
GLUCOSE: 145 mg/dL — AB (ref 70–99)
POTASSIUM: 3 meq/L — AB (ref 3.7–5.3)
Sodium: 131 mEq/L — ABNORMAL LOW (ref 137–147)
Total Protein: 6.7 g/dL (ref 6.0–8.3)

## 2014-02-01 LAB — LIPASE, BLOOD: LIPASE: 13 U/L (ref 11–59)

## 2014-02-01 MED ORDER — VANCOMYCIN HCL IN DEXTROSE 1-5 GM/200ML-% IV SOLN: 1000.0000 mg | Freq: Once | INTRAVENOUS | Status: DC

## 2014-02-01 MED ORDER — HYDROCODONE-ACETAMINOPHEN 5-325 MG PO TABS: 1.0000 | ORAL_TABLET | ORAL | Status: DC | PRN

## 2014-02-01 MED ORDER — ENOXAPARIN SODIUM 40 MG/0.4ML ~~LOC~~ SOLN
40.0000 mg | Freq: Every day | SUBCUTANEOUS | Status: DC
  Administered 2014-02-02: 40 mg via SUBCUTANEOUS
  Filled 2014-02-01: qty 0.4

## 2014-02-01 MED ORDER — SODIUM CHLORIDE 0.9 % IV SOLN
INTRAVENOUS | Status: DC
  Administered 2014-02-01: via INTRAVENOUS

## 2014-02-01 MED ORDER — POTASSIUM CHLORIDE CRYS ER 20 MEQ PO TBCR
40.0000 meq | EXTENDED_RELEASE_TABLET | Freq: Once | ORAL | Status: AC
  Administered 2014-02-01: 40 meq via ORAL
  Filled 2014-02-01: qty 2

## 2014-02-01 MED ORDER — PANTOPRAZOLE SODIUM 40 MG PO TBEC
40.0000 mg | DELAYED_RELEASE_TABLET | Freq: Every day | ORAL | Status: DC
  Administered 2014-02-02: 40 mg via ORAL
  Filled 2014-02-01: qty 1

## 2014-02-01 MED ORDER — GI COCKTAIL ~~LOC~~
30.0000 mL | Freq: Once | ORAL | Status: AC
  Administered 2014-02-01: 30 mL via ORAL
  Filled 2014-02-01: qty 30

## 2014-02-01 MED ORDER — PIPERACILLIN-TAZOBACTAM 3.375 G IVPB 30 MIN: 3.3750 g | Freq: Once | INTRAVENOUS | Status: DC

## 2014-02-01 MED ORDER — ATORVASTATIN CALCIUM 40 MG PO TABS
40.0000 mg | ORAL_TABLET | Freq: Every day | ORAL | Status: DC
  Administered 2014-02-02 – 2014-02-11 (×9): 40 mg via ORAL
  Filled 2014-02-01 (×10): qty 1

## 2014-02-01 MED ORDER — ONDANSETRON HCL 4 MG/2ML IJ SOLN
4.0000 mg | Freq: Four times a day (QID) | INTRAMUSCULAR | Status: DC | PRN
  Administered 2014-02-01 – 2014-02-03 (×4): 4 mg via INTRAVENOUS
  Filled 2014-02-01 (×4): qty 2

## 2014-02-01 MED ORDER — MELOXICAM 7.5 MG PO TABS
7.5000 mg | ORAL_TABLET | Freq: Two times a day (BID) | ORAL | Status: DC
  Administered 2014-02-02: 7.5 mg via ORAL
  Filled 2014-02-01 (×3): qty 1

## 2014-02-01 MED ORDER — ASPIRIN EC 81 MG PO TBEC
81.0000 mg | DELAYED_RELEASE_TABLET | Freq: Every day | ORAL | Status: DC
  Administered 2014-02-02 – 2014-02-05 (×3): 81 mg via ORAL
  Filled 2014-02-01 (×5): qty 1

## 2014-02-01 MED ORDER — VANCOMYCIN HCL IN DEXTROSE 1-5 GM/200ML-% IV SOLN
1000.0000 mg | Freq: Two times a day (BID) | INTRAVENOUS | Status: DC
  Administered 2014-02-02 – 2014-02-03 (×4): 1000 mg via INTRAVENOUS
  Filled 2014-02-01 (×6): qty 200

## 2014-02-01 MED ORDER — ONDANSETRON HCL 4 MG/2ML IJ SOLN
4.0000 mg | Freq: Once | INTRAMUSCULAR | Status: AC
  Administered 2014-02-01: 4 mg via INTRAVENOUS
  Filled 2014-02-01: qty 2

## 2014-02-01 MED ORDER — METHOCARBAMOL 500 MG PO TABS: 500.0000 mg | ORAL_TABLET | Freq: Three times a day (TID) | ORAL | Status: DC | PRN

## 2014-02-01 MED ORDER — PIPERACILLIN-TAZOBACTAM 3.375 G IVPB
3.3750 g | Freq: Three times a day (TID) | INTRAVENOUS | Status: DC
  Administered 2014-02-02 (×2): 3.375 g via INTRAVENOUS
  Filled 2014-02-01 (×5): qty 50

## 2014-02-01 MED ORDER — SODIUM CHLORIDE 0.9 % IV BOLUS (SEPSIS)
1000.0000 mL | Freq: Once | INTRAVENOUS | Status: AC
  Administered 2014-02-01: 1000 mL via INTRAVENOUS

## 2014-02-01 MED ORDER — CLINDAMYCIN PHOSPHATE 600 MG/50ML IV SOLN
600.0000 mg | Freq: Once | INTRAVENOUS | Status: AC
  Administered 2014-02-01: 600 mg via INTRAVENOUS
  Filled 2014-02-01: qty 50

## 2014-02-01 MED ORDER — ONDANSETRON HCL 4 MG PO TABS: 4.0000 mg | ORAL_TABLET | Freq: Four times a day (QID) | ORAL | Status: DC | PRN

## 2014-02-01 MED ORDER — VANCOMYCIN HCL 10 G IV SOLR
1500.0000 mg | Freq: Once | INTRAVENOUS | Status: AC
  Administered 2014-02-01: 1500 mg via INTRAVENOUS
  Filled 2014-02-01: qty 1500

## 2014-02-01 NOTE — H&P (Addendum)
[facility] History and Physical  Sheri JensenGwynn D Edick AOZ:[id number] DOB: 1940/04/13 DOA: 02/01/2014  Referring physician: ER physician PCP: Josue HectorNYLAND,LEONARD ROBERT, MD   Chief Complaint: nausea and poor oral intake   HPI:  Pt is 73 yo female, status post recent total right knee arthroplasty (12/20/2013), [facility] with main concern of several days duration of progressively worsening nausea associated with poor oral intake. Pt denies any known triggering factors but explains she was recently started on Macrobid for UTI and it seems to her, nausea started right after she was started on the medication. She also reports upper abd quadrants discomfort, intermittent and dull in etiology, 5/10 in severity when present, non radiating and with no specific alleviating factors. She denies recent sick contacts or exposures.   In ED, pt noted to be hemodynamically stable, VSS except low grade fever 99.5 F, physical exam fairly unremarkable but right foot erythema noted and findings consistent with cellulitis. TRH asked to admit for further management.   Assessment & Plan    Active Problems: Right foot cellulitis - will admit to medical floor  - place on vancomycin and zosyn for now and narrow down as clinically indicated - provide IVF, analgesia as needed - keep extremity elevated - LE doppler to rule out DVT. Nausea and abd discomfort - unclear etiology and possibly related to recent ABX use - will proceed with UA, hold Macrobid - current ABX should be adequate in case of UTI - lipase is WNL, consider abd US if no improvement in symptoms  - follow up on urine culture - provide antiemetics as needed  HTN - stable on admission - continue home medical regimen HLD  - continue statin  Hypokalemia - will supplement and repeat BMP in AM Hyponatremia - mild, possibly pre renal - IVF will be provided and will repeat BMP in AM  DVT prophylaxis: Lovenox SQ  Radiological Exams on  Admission: Dg Chest 2 View  02/01/2014   CLINICAL DATA:  Cough.  EXAM: CHEST  2 VIEW  COMPARISON:  12/12/2013  FINDINGS: Borderline cardiomegaly remains stable. Mild bibasilar scarring is also unchanged. No evidence of pulmonary infiltrate or edema. No evidence of pleural effusion.  IMPRESSION: Stable exam.  No active disease.   Electronically Signed   By: Myles RosenthalJohn  Stahl M.D.   On: 02/01/2014 15:40    EKG: pending   Code Status: Full Family Communication: Plan of care discussed with the patient  Disposition Plan: Admit for further evaluation  Manson PasseyEVINE, Maryela Tapper, MD  [facility] Pager [phone]  Review of Systems:  Constitutional: Negative for diaphoresis.  HENT: Negative for hearing loss, congestion, sore throat, neck pain, tinnitus and ear discharge.   Eyes: Negative for blurred vision, double vision, photophobia, pain, discharge and redness.  Respiratory: Negative for cough, hemoptysis Cardiovascular: Negative for chest pain, palpitations, orthopnea, claudication and leg swelling.  Gastrointestinal: Negative for heartburn, constipation, blood in stool and melena.  Genitourinary: Negative for dysuria, urgency, frequency, hematuria and flank pain.  Musculoskeletal: Negative for myalgias, back pain, joint pain and falls.  Skin: per HPI Neurological: Negative for dizziness and weakness. Endo/Heme/Allergies: Negative for environmental allergies and polydipsia.  Psychiatric/Behavioral: Negative for suicidal ideas. The patient is not nervous/anxious.      Past Medical History  Diagnosis Date  . Hypertension   . GERD (gastroesophageal reflux disease)   . Arthritis    Past Surgical History  Procedure Laterality Date  . Tonsillectomy    . Joint replacement Left   . Cesarean  section    . Total knee arthroplasty Right 12/20/2013    Procedure: RIGHT TOTAL KNEE ARTHROPLASTY;  Surgeon: Verlee Rossetti, MD;  Location: [facility] OR;  Service: Orthopedics;  Laterality: Right;   Social History:   reports that she has never smoked. She does not have any smokeless tobacco history on file. She reports that she does not drink alcohol or use illicit drugs.  No Known Allergies  Family History: htn in family.    Prior to Admission medications   Medication Sig Start Date End Date Taking? Authorizing Provider  aspirin 81 MG tablet Take 81 mg by mouth daily.   Yes Historical Provider, MD  atorvastatin (LIPITOR) 40 MG tablet Take 40 mg by mouth daily.   Yes Historical Provider, MD  esomeprazole (NEXIUM) 40 MG capsule Take 40 mg by mouth daily at 12 noon.   Yes Historical Provider, MD  meloxicam (MOBIC) 7.5 MG tablet Take 7.5 mg by mouth 2 (two) times daily.   Yes Historical Provider, MD  methocarbamol (ROBAXIN) 500 MG tablet Take 1 tablet (500 mg total) by mouth 3 (three) times daily as needed. 12/20/13  Yes Verlee Rossetti, MD  ondansetron (ZOFRAN) 4 MG tablet Take 1 tablet (4 mg total) by mouth every 8 (eight) hours as needed for nausea or vomiting. 12/23/13  Yes Thea Gist, PA-C  potassium chloride SA (K-DUR,KLOR-CON) 20 MEQ tablet Take 10 mEq by mouth daily.   Yes Historical Provider, MD  telmisartan-hydrochlorothiazide (MICARDIS HCT) 40-12.5 MG per tablet Take 1.5 tablets by mouth daily.   Yes Historical Provider, MD   Physical Exam: Filed Vitals:   02/01/14 2130 02/01/14 2141 02/01/14 2145 02/01/14 2200  BP: 153/65 153/65  157/60  Pulse: 87 90 93 91  Temp:      TempSrc:      Resp: 32 33 29 33  SpO2: 97% 97% 96% 97%    Physical Exam  Constitutional: Appears well-developed and well-nourished. No distress.  HENT: Normocephalic. No tonsillar erythema or exudates Eyes: Conjunctivae and EOM are normal. PERRLA, no scleral icterus.  Neck: Normal ROM. Neck supple. No JVD. No tracheal deviation. No thyromegaly.  CVS: RRR, S1/S2 +, no murmurs, no gallops, no carotid bruit.  Pulmonary: Effort and breath sounds normal, no stridor, rhonchi, wheezes, rales.  Abdominal: Soft. BS +,  no  distension, tenderness in upper abd quadrants, no rebound or guarding.  Musculoskeletal: Normal range of motion. Right foot edema and erythema, TTP Lymphadenopathy: No lymphadenopathy noted, cervical, inguinal. Neuro: Alert. Normal reflexes, muscle tone coordination. No focal neurologic deficits. Skin: Skin is warm and dry. Right LE redness, swelling. Psychiatric: Normal mood and affect.   Labs on Admission:  Basic Metabolic Panel:  Recent Labs Lab 02/01/14 1455  NA 131*  K 3.0*  CL 90*  CO2 25  GLUCOSE 145*  BUN 20  CREATININE 0.64  CALCIUM 8.7   Liver Function Tests:  Recent Labs Lab 02/01/14 1455  AST 25  ALT 28  ALKPHOS 132*  BILITOT 0.3  PROT 6.7  ALBUMIN 3.0*    Recent Labs Lab 02/01/14 1455  LIPASE 13   CBC:  Recent Labs Lab 02/01/14 1455  WBC 9.9  NEUTROABS 9.3*  HGB 11.6*  HCT 35.9*  MCV 87.6  PLT 274   Cardiac Enzymes: No results found for this basename: CKTOTAL, CKMB, CKMBINDEX, TROPONINI,  in the last 168 hours BNP: No components found with this basename: POCBNP,  CBG: No results found for this basename: GLUCAP,  in the last  168 hours  If 7PM-7AM, please contact night-coverage [email or web address] Password [email or web address] 02/01/2014, 10:11 PM

## 2014-02-01 NOTE — ED Provider Notes (Signed)
CSN: [id number]     Arrival date & time 02/01/14  1405 History   First MD Initiated Contact with Patient 02/01/14 1554     Chief Complaint  Patient presents with  . Cough  . Fever     (Consider location/radiation/quality/duration/timing/severity/associated sxs/prior Treatment) Patient is a 73 y.o. female presenting with vomiting.  Emesis Severity:  Moderate Duration:  4 days Timing:  Intermittent Emesis appearance: "dry heaving" Progression:  Unchanged Chronicity:  New Relieved by:  Nothing Exacerbated by: eating, drinking. Ineffective treatments:  None tried Associated symptoms: cough (productive of sputum) and fever (101.5 yesterday, no fevers today)   Associated symptoms: no abdominal pain and no diarrhea     Past Medical History  Diagnosis Date  . Hypertension   . GERD (gastroesophageal reflux disease)   . Arthritis    Past Surgical History  Procedure Laterality Date  . Tonsillectomy    . Joint replacement Left   . Cesarean section    . Total knee arthroplasty Right 12/20/2013    Procedure: RIGHT TOTAL KNEE ARTHROPLASTY;  Surgeon: Verlee RossettiSteven R Norris, MD;  [facility];  Service: Orthopedics;  Laterality: Right;   History reviewed. No pertinent family history. History  Substance Use Topics  . Smoking status: Never Smoker   . Smokeless tobacco: Not on file  . Alcohol Use: No   OB History   Grav Para Term Preterm Abortions TAB SAB Ect Mult Living                 Review of Systems  Constitutional: Positive for fever.  HENT: Negative for congestion.   Respiratory: Positive for cough. Negative for shortness of breath.   Cardiovascular: Negative for chest pain.  Gastrointestinal: Positive for vomiting. Negative for nausea, abdominal pain and diarrhea.  All other systems reviewed and are negative.     Allergies  Review of patient's allergies indicates no known allergies.  Home Medications   Prior to Admission medications   Medication Sig Start Date End  Date Taking? Authorizing Provider  aspirin 81 MG tablet Take 81 mg by mouth daily.    Historical Provider, MD  atorvastatin (LIPITOR) 40 MG tablet Take 40 mg by mouth daily.    Historical Provider, MD  esomeprazole (NEXIUM) 40 MG capsule Take 40 mg by mouth daily at 12 noon.    Historical Provider, MD  meloxicam (MOBIC) 7.5 MG tablet Take 7.5 mg by mouth 2 (two) times daily.    Historical Provider, MD  methocarbamol (ROBAXIN) 500 MG tablet Take 1 tablet (500 mg total) by mouth 3 (three) times daily as needed. 12/20/13   Verlee RossettiSteven R Norris, MD  ondansetron (ZOFRAN) 4 MG tablet Take 1 tablet (4 mg total) by mouth every 8 (eight) hours as needed for nausea or vomiting. 12/23/13   Thea Gisthomas B Dixon, PA-C  oxyCODONE-acetaminophen (ROXICET) 5-325 MG per tablet Take 1-2 tablets by mouth every 4 (four) hours as needed for severe pain. 12/20/13   Verlee RossettiSteven R Norris, MD  potassium chloride SA (K-DUR,KLOR-CON) 20 MEQ tablet Take 10 mEq by mouth daily.    Historical Provider, MD  telmisartan-hydrochlorothiazide (MICARDIS HCT) 40-12.5 MG per tablet Take 1.5 tablets by mouth daily.    Historical Provider, MD  warfarin (COUMADIN) 5 MG tablet Take 1 tablet (5 mg total) by mouth daily. 12/20/13   Verlee RossettiSteven R Norris, MD   BP 135/67  Pulse 101  Temp(Src) 99.5 F (37.5 C) (Oral)  Resp 14  SpO2 96% Physical Exam  Nursing note and vitals reviewed.  Constitutional: She is oriented to person, place, and time. She appears well-developed and well-nourished. No distress.  HENT:  Head: Normocephalic and atraumatic.  Mouth/Throat: Oropharynx is clear and moist.  Eyes: Conjunctivae are normal. Pupils are equal, round, and reactive to light. No scleral icterus.  Neck: Neck supple.  Cardiovascular: Normal rate, regular rhythm, normal heart sounds and intact distal pulses.   No murmur heard. Pulmonary/Chest: Effort normal and breath sounds normal. No stridor. No respiratory distress. She has no rales.  Abdominal: Soft. Bowel sounds are  normal. She exhibits no distension. There is tenderness (minimal) in the right upper quadrant, epigastric area and left upper quadrant. There is no rigidity, no rebound and no guarding.  Musculoskeletal: Normal range of motion.  Neurological: She is alert and oriented to person, place, and time.  Skin: Skin is warm and dry. No rash noted.  Tender erythema of right lower extremity from mid calf down. Warm to touch. Minimal edema. Distal pulses intact.  Psychiatric: She has a normal mood and affect. Her behavior is normal.    ED Course  Procedures (including critical care time) Labs Review Labs Reviewed  CBC WITH DIFFERENTIAL - Abnormal; Notable for the following:    Hemoglobin 11.6 (*)    HCT 35.9 (*)    Neutrophils Relative % 94 (*)    Neutro Abs 9.3 (*)    Lymphocytes Relative 4 (*)    Lymphs Abs 0.4 (*)    Monocytes Relative 2 (*)    All other components within normal limits  COMPREHENSIVE METABOLIC PANEL - Abnormal; Notable for the following:    Sodium 131 (*)    Potassium 3.0 (*)    Chloride 90 (*)    Glucose, Bld 145 (*)    Albumin 3.0 (*)    Alkaline Phosphatase 132 (*)    GFR calc non Af Amer 87 (*)    Anion gap 16 (*)    All other components within normal limits  URINALYSIS, ROUTINE W REFLEX MICROSCOPIC - Abnormal; Notable for the following:    Color, Urine AMBER (*)    APPearance CLOUDY (*)    Bilirubin Urine SMALL (*)    Ketones, ur 15 (*)    Protein, ur 100 (*)    All other components within normal limits  URINE MICROSCOPIC-ADD ON - Abnormal; Notable for the following:    Squamous Epithelial / LPF FEW (*)    All other components within normal limits  URINE CULTURE    Imaging Review Dg Chest 2 View  02/01/2014   CLINICAL DATA:  Cough.  EXAM: CHEST  2 VIEW  COMPARISON:  12/12/2013  FINDINGS: Borderline cardiomegaly remains stable. Mild bibasilar scarring is also unchanged. No evidence of pulmonary infiltrate or edema. No evidence of pleural effusion.   IMPRESSION: Stable exam.  No active disease.   Electronically Signed   By: Myles RosenthalJohn  Stahl M.D.   On: 02/01/2014 15:40  All radiology studies independently viewed by me.      EKG Interpretation None      MDM   Final diagnoses:  Cough  Cellulitis Vomiting  73 year old female presenting with a chief complaint of postprandial vomiting. She also has mild upper abdominal discomfort and minimal upper abdominal tenderness. She is status post cholecystectomy. Lipase is normal. Given GI cocktail for symptoms.  On exam, she was noted to have significant area of erythema of her right foot and right lower extremity. Exam consistent with cellulitis. Treated with IV Clinda. She will need admission to internal medicine  for further treatment.    Warnell Forester, MD 02/01/14 [phone]

## 2014-02-01 NOTE — Progress Notes (Signed)
ANTIBIOTIC CONSULT NOTE - INITIAL  Pharmacy Consult for Vancomycin and Zosyn  Indication: cellulitis  No Known Allergies  Patient Measurements: Height: 5' 4.96" (165 cm) Weight: 216 lb 0.8 oz (98 kg) (12/2013) IBW/kg (Calculated) : 56.91 Adjusted Body Weight: 75 kg  Vital Signs: Temp: 98.9 F (37.2 C) (10/31 2318) Temp Source: Oral (10/31 2318) BP: 169/77 mmHg (10/31 2318) Pulse Rate: 90 (10/31 2318) Intake/Output from previous day:   Intake/Output from this shift: Total I/O In: 1000 [I.V.:1000] Out: -   Labs:  Recent Labs  02/01/14 1455  WBC 9.9  HGB 11.6*  PLT 274  CREATININE 0.64   Estimated Creatinine Clearance: 73.6 ml/min (by C-G formula based on Cr of 0.64). No results found for this basename: VANCOTROUGH, VANCOPEAK, VANCORANDOM, GENTTROUGH, GENTPEAK, GENTRANDOM, TOBRATROUGH, TOBRAPEAK, TOBRARND, AMIKACINPEAK, AMIKACINTROU, AMIKACIN,  in the last 72 hours   Microbiology: No results found for this or any previous visit (from the past 720 hour(s)).  Medical History: Past Medical History  Diagnosis Date  . Hypertension   . GERD (gastroesophageal reflux disease)   . Arthritis     Medications:  Prescriptions prior to admission  Medication Sig Dispense Refill  . aspirin 81 MG tablet Take 81 mg by mouth daily.      Marland Kitchen. atorvastatin (LIPITOR) 40 MG tablet Take 40 mg by mouth daily.      Marland Kitchen. esomeprazole (NEXIUM) 40 MG capsule Take 40 mg by mouth daily at 12 noon.      . meloxicam (MOBIC) 7.5 MG tablet Take 7.5 mg by mouth 2 (two) times daily.      . methocarbamol (ROBAXIN) 500 MG tablet Take 1 tablet (500 mg total) by mouth 3 (three) times daily as needed.  60 tablet  1  . ondansetron (ZOFRAN) 4 MG tablet Take 1 tablet (4 mg total) by mouth every 8 (eight) hours as needed for nausea or vomiting.  60 tablet  0  . potassium chloride SA (K-DUR,KLOR-CON) 20 MEQ tablet Take 10 mEq by mouth daily.      Marland Kitchen. telmisartan-hydrochlorothiazide (MICARDIS HCT) 40-12.5 MG per  tablet Take 1.5 tablets by mouth daily.       Assessment: 73 yo female with right foot cellulitis for empiric antibiotics  Goal of Therapy:  Vancomycin trough level 10-15 mcg/ml  Plan:  Vancomycin 1500 mg IV now, then 1 g IV q12h Zosyn 3.375 g IV q8h    Kaneisha Ellenberger, Gary FleetGregory Vernon 02/01/2014,11:30 PM

## 2014-02-01 NOTE — ED Notes (Signed)
MD at bedside. 

## 2014-02-01 NOTE — ED Notes (Addendum)
Fever x 2 days; went to pcp and prescribed macorib for a UTI. Dry productive cough. No cp. Temp last nigh >101. F

## 2014-02-02 ENCOUNTER — Inpatient Hospital Stay (HOSPITAL_COMMUNITY): Payer: Medicare Other

## 2014-02-02 DIAGNOSIS — E785 Hyperlipidemia, unspecified: Secondary | ICD-10-CM

## 2014-02-02 DIAGNOSIS — I10 Essential (primary) hypertension: Secondary | ICD-10-CM

## 2014-02-02 DIAGNOSIS — R110 Nausea: Secondary | ICD-10-CM

## 2014-02-02 DIAGNOSIS — E871 Hypo-osmolality and hyponatremia: Secondary | ICD-10-CM

## 2014-02-02 DIAGNOSIS — E876 Hypokalemia: Secondary | ICD-10-CM

## 2014-02-02 LAB — BASIC METABOLIC PANEL
Anion gap: 11 (ref 5–15)
BUN: 23 mg/dL (ref 6–23)
CO2: 27 meq/L (ref 19–32)
Calcium: 8.1 mg/dL — ABNORMAL LOW (ref 8.4–10.5)
Chloride: 101 mEq/L (ref 96–112)
Creatinine, Ser: 0.7 mg/dL (ref 0.50–1.10)
GFR calc Af Amer: >90 mL/min (ref >90)
GFR calc non Af Amer: 85 mL/min — ABNORMAL LOW (ref >90)
GLUCOSE: 109 mg/dL — AB (ref 70–99)
POTASSIUM: 3.2 meq/L — AB (ref 3.7–5.3)
SODIUM: 139 meq/L (ref 137–147)

## 2014-02-02 LAB — URINE CULTURE

## 2014-02-02 LAB — CBC
HEMATOCRIT: 31.6 % — AB (ref 36.0–46.0)
Hemoglobin: 10.1 g/dL — ABNORMAL LOW (ref 12.0–15.0)
MCH: 28.1 pg (ref 26.0–34.0)
MCHC: 32 g/dL (ref 30.0–36.0)
MCV: 88 fL (ref 78.0–100.0)
PLATELETS: 221 10*3/uL (ref 150–400)
RBC: 3.59 MIL/uL — AB (ref 3.87–5.11)
RDW: 14.3 % (ref 11.5–15.5)
WBC: 5.7 10*3/uL (ref 4.0–10.5)

## 2014-02-02 MED ORDER — HYDROCODONE-ACETAMINOPHEN 5-325 MG PO TABS
1.0000 | ORAL_TABLET | Freq: Four times a day (QID) | ORAL | Status: DC | PRN
  Administered 2014-02-03: 1 via ORAL
  Filled 2014-02-02: qty 1

## 2014-02-02 MED ORDER — PROMETHAZINE HCL 25 MG/ML IJ SOLN
12.5000 mg | INTRAMUSCULAR | Status: DC | PRN
  Administered 2014-02-02 – 2014-02-04 (×7): 12.5 mg via INTRAVENOUS
  Filled 2014-02-02 (×7): qty 1

## 2014-02-02 MED ORDER — BISACODYL 10 MG RE SUPP
10.0000 mg | Freq: Every day | RECTAL | Status: DC
Start: 2014-02-02 — End: 2014-02-03

## 2014-02-02 MED ORDER — PIPERACILLIN-TAZOBACTAM 3.375 G IVPB
3.3750 g | Freq: Three times a day (TID) | INTRAVENOUS | Status: DC
  Administered 2014-02-02 – 2014-02-03 (×2): 3.375 g via INTRAVENOUS
  Filled 2014-02-02 (×4): qty 50

## 2014-02-02 MED ORDER — ENOXAPARIN SODIUM 60 MG/0.6ML ~~LOC~~ SOLN
50.0000 mg | Freq: Every day | SUBCUTANEOUS | Status: DC
  Administered 2014-02-03 – 2014-02-05 (×3): 50 mg via SUBCUTANEOUS
  Filled 2014-02-02 (×4): qty 0.6

## 2014-02-02 MED ORDER — MAGNESIUM SULFATE IN D5W 10-5 MG/ML-% IV SOLN
1.0000 g | Freq: Once | INTRAVENOUS | Status: AC
Start: 2014-02-02 — End: 2014-02-02
  Administered 2014-02-02: 1 g via INTRAVENOUS
  Filled 2014-02-02: qty 100

## 2014-02-02 MED ORDER — PNEUMOCOCCAL VAC POLYVALENT 25 MCG/0.5ML IJ INJ
0.5000 mL | INJECTION | INTRAMUSCULAR | Status: DC
  Filled 2014-02-02: qty 0.5

## 2014-02-02 MED ORDER — AMLODIPINE BESYLATE 10 MG PO TABS
10.0000 mg | ORAL_TABLET | Freq: Every day | ORAL | Status: DC
  Administered 2014-02-02 – 2014-02-11 (×9): 10 mg via ORAL
  Filled 2014-02-02 (×10): qty 1

## 2014-02-02 MED ORDER — POTASSIUM CHLORIDE IN NACL 40-0.9 MEQ/L-% IV SOLN
INTRAVENOUS | Status: AC
  Administered 2014-02-02 – 2014-02-03 (×2): 75 mL/h via INTRAVENOUS
  Filled 2014-02-02 (×2): qty 1000

## 2014-02-02 MED ORDER — PANTOPRAZOLE SODIUM 40 MG IV SOLR
40.0000 mg | Freq: Two times a day (BID) | INTRAVENOUS | Status: DC
  Administered 2014-02-02 – 2014-02-07 (×12): 40 mg via INTRAVENOUS
  Filled 2014-02-02 (×15): qty 40

## 2014-02-02 MED ORDER — POTASSIUM CHLORIDE CRYS ER 20 MEQ PO TBCR
40.0000 meq | EXTENDED_RELEASE_TABLET | Freq: Once | ORAL | Status: AC
Start: 2014-02-02 — End: 2014-02-02
  Administered 2014-02-02: 40 meq via ORAL

## 2014-02-02 NOTE — Progress Notes (Signed)
Patient IV left AC occluded at beginning of shift. Attempted to reposition and flush with no results. Attempted insertion on right hand with no results. Charge nurse evaluated as well.  IV team paged and will come insert new IV.

## 2014-02-02 NOTE — Progress Notes (Signed)
Patient nauseous with dry heaves since mid morning. Zofran 4mg  IV given at 12:24 p.m. Per Dr. Thedore MinsSingh, gave Phenergan 12.5 mg IV at 14:53. Patient states phenergan helped "a little bit". Patient is NPO until she is no longer dry heaving.

## 2014-02-02 NOTE — Progress Notes (Signed)
PT Cancellation Note  Patient Details Name: Sheri JensenGwynn D Mccoy MRN: [id number] DOB: 1940-08-14   Cancelled Treatment:    Reason Eval/Treat Not Completed: Medical issues which prohibited therapy.  Received PT consult.  Chart reviewed.  Order for doppler to r/o RLE DVT.  Will initiate PT evaluation once DVT is ruled out.  Thank you.   Vena AustriaDavis, Deandria Klute H 02/02/2014, 12:40 PM Durenda HurtSusan H. Renaldo Fiddleravis, PT, [facility] Pager [phone]

## 2014-02-02 NOTE — Progress Notes (Signed)
Dry heaving.

## 2014-02-02 NOTE — Progress Notes (Signed)
Patient Demographics  Sheri Mccoy, is a 73 y.o. female, DOB - 01/03/1941, ZOX:[id number]  Admit date - 02/01/2014   Admitting Physician Alison MurrayAlma M Devine, MD  Outpatient Primary MD for the patient is Josue HectorNYLAND,LEONARD ROBERT, MD  LOS - 1   Chief Complaint  Patient presents with  . Cough  . Fever        Subjective:   Sheri Mccoy today has, No headache, No chest pain, No abdominal pain - No Nausea, No new weakness tingling or numbness, No Cough - SOB. No nausea, passing flatus.  Assessment & Plan    1.Right leg cellulitis. Minimal. Much improved. Continue empiric antibiotics, switched to doxycycline once more improved. Admission venous duplex pending, doubt she has a DVT. Did have recent right leg knee surgery.Lovenox for DVT prophylaxis. PT eval and treat.   2. Nausea vomiting with hyponatremia and hypokalemia - likely due to recent narcotic use, has mild adynamic ileus, much improved, passing flatness and no abdominal pain or nausea now. Had BM yesterday. KUB nonacute. Replace potassium, monitor potassium magnesium. Supportive care. Reduce narcotics increase activity.   3. Essential hypertension. Will add  Norvasc.   4. Dyslipidemia. Home dose statin continued.    5. GERD. On PPI.       Code Status: full  Family Communication: none present  Disposition Plan: TBD   Procedures   R leg US pending   Consults  none   Medications  Scheduled Meds: . aspirin EC  81 mg Oral Daily  . atorvastatin  40 mg Oral Daily  . bisacodyl  10 mg Rectal Daily  . enoxaparin (LOVENOX) injection  40 mg Subcutaneous Daily  . meloxicam  7.5 mg Oral BID WC  . pantoprazole  40 mg Oral Daily  . piperacillin-tazobactam (ZOSYN)  IV  3.375 g Intravenous 3 times per day  . [START ON 02/03/2014]  pneumococcal 23 valent vaccine  0.5 mL Intramuscular Tomorrow-1000  . vancomycin  1,000 mg Intravenous Q12H   Continuous Infusions: . sodium chloride 75 mL/hr at 02/01/14 2346   PRN Meds:.HYDROcodone-acetaminophen, [DISCONTINUED] ondansetron **OR** ondansetron (ZOFRAN) IV  DVT Prophylaxis  Lovenox    Lab Results  Component Value Date   PLT 221 02/02/2014    Antibiotics     Anti-infectives    Start     Dose/Rate Route Frequency Ordered Stop   02/02/14 0800  vancomycin (VANCOCIN) IVPB 1000 mg/200 mL premix     1,000 mg200 mL/hr over 60 Minutes Intravenous Every 12 hours 02/01/14 2336     02/01/14 2359  piperacillin-tazobactam (ZOSYN) IVPB 3.375 g     3.375 g12.5 mL/hr over 240 Minutes Intravenous 3 times per day 02/01/14 2336     02/01/14 2345  vancomycin (VANCOCIN) 1,500 mg in sodium chloride 0.9 % 500 mL IVPB     1,500 mg250 mL/hr over 120 Minutes Intravenous  Once 02/01/14 2336 02/02/14 0145   02/01/14 2330  piperacillin-tazobactam (ZOSYN) IVPB 3.375 g  Status:  Discontinued     3.375 g100 mL/hr over 30 Minutes Intravenous  Once 02/01/14 2328 02/01/14 2332   02/01/14 2330  vancomycin (VANCOCIN) IVPB 1000 mg/200 mL premix  Status:  Discontinued     1,000 mg200 mL/hr over 60 Minutes Intravenous  Once 02/01/14 2328 02/01/14  2332   02/01/14 1700  clindamycin (CLEOCIN) IVPB 600 mg     600 mg100 mL/hr over 30 Minutes Intravenous  Once 02/01/14 1658 02/01/14 1757          Objective:   Filed Vitals:   02/01/14 2238 02/01/14 2318 02/02/14 0630 02/02/14 0734  BP:  169/77  157/64  Pulse:  90  78  Temp: 98.6 F (37 C) 98.9 F (37.2 C)  98.4 F (36.9 C)  TempSrc:  Oral  Oral  Resp:  18  16  Height:  5' 4.96" (1.65 m) 5\' 4"  (1.626 m)   Weight:  98 kg (216 lb 0.8 oz) 96.2 kg (212 lb 1.3 oz) 98.6 kg (217 lb 6 oz)  SpO2:  96%  100%    Wt Readings from Last 3 Encounters:  02/02/14 98.6 kg (217 lb 6 oz)  12/20/13 98.232 kg (216 lb 9 oz)  12/12/13 98.249 kg (216 lb 9.6 oz)      Intake/Output Summary (Last 24 hours) at 02/02/14 1021 Last data filed at 02/01/14 2038  Gross per 24 hour  Intake   1000 ml  Output      0 ml  Net   1000 ml     Physical Exam  Awake Alert, Oriented X 3, No new F.N deficits, Normal affect [place].AT,PERRAL Supple Neck,No JVD, No cervical lymphadenopathy appriciated.  Symmetrical Chest wall movement, Good air movement bilaterally, CTAB RRR,No Gallops,Rubs or new Murmurs, No Parasternal Heave +ve B.Sounds, Abd Soft, No tenderness, No organomegaly appriciated, No rebound - guarding or rigidity. No Cyanosis, Clubbing or edema, No new Rash or bruise , mild R leg redness and warmth   Data Review   Micro Results No results found for this or any previous visit (from the past 240 hour(s)).  Radiology Reports Dg Chest 2 View  02/01/2014   CLINICAL DATA:  Cough.  EXAM: CHEST  2 VIEW  COMPARISON:  12/12/2013  FINDINGS: Borderline cardiomegaly remains stable. Mild bibasilar scarring is also unchanged. No evidence of pulmonary infiltrate or edema. No evidence of pleural effusion.  IMPRESSION: Stable exam.  No active disease.   Electronically Signed   By: Myles RosenthalJohn  Stahl M.D.   On: 02/01/2014 15:40   Dg Abd Portable 1v  02/02/2014   CLINICAL DATA:  Abdominal pain.  EXAM: PORTABLE ABDOMEN - 1 VIEW  COMPARISON:  No prior .  FINDINGS: Soft tissue structures are unremarkable. Slightly distended air-filled loops of small large bowel noted. These findings most consistent with adynamic ileus. Surgical clips right upper quadrant consistent with cholecystectomy. Left hip replacement. Pelvic phleboliths. Degenerative changes lumbar spine right hip.  IMPRESSION: Findings most consistent with mild adynamic ileus.   Electronically Signed   By: Maisie Fushomas  Register   On: 02/02/2014 08:31     CBC  Recent Labs Lab 02/01/14 1455 02/02/14 0619  WBC 9.9 5.7  HGB 11.6* 10.1*  HCT 35.9* 31.6*  PLT 274 221  MCV 87.6 88.0  MCH 28.3 28.1  MCHC 32.3 32.0  RDW 14.1  14.3  LYMPHSABS 0.4*  --   MONOABS 0.2  --   EOSABS 0.0  --   BASOSABS 0.0  --     Chemistries   Recent Labs Lab 02/01/14 1455 02/02/14 0619  NA 131* 139  K 3.0* 3.2*  CL 90* 101  CO2 25 27  GLUCOSE 145* 109*  BUN 20 23  CREATININE 0.64 0.70  CALCIUM 8.7 8.1*  AST 25  --   ALT 28  --  ALKPHOS 132*  --   BILITOT 0.3  --    ------------------------------------------------------------------------------------------------------------------ estimated creatinine clearance is 72.6 mL/min (by C-G formula based on Cr of 0.7). ------------------------------------------------------------------------------------------------------------------ No results for input(s): HGBA1C in the last 72 hours. ------------------------------------------------------------------------------------------------------------------ No results for input(s): CHOL, HDL, LDLCALC, TRIG, CHOLHDL, LDLDIRECT in the last 72 hours. ------------------------------------------------------------------------------------------------------------------ No results for input(s): TSH, T4TOTAL, T3FREE, THYROIDAB in the last 72 hours.  Invalid input(s): FREET3 ------------------------------------------------------------------------------------------------------------------ No results for input(s): VITAMINB12, FOLATE, FERRITIN, TIBC, IRON, RETICCTPCT in the last 72 hours.  Coagulation profile No results for input(s): INR, PROTIME in the last 168 hours.  No results for input(s): DDIMER in the last 72 hours.  Cardiac Enzymes No results for input(s): CKMB, TROPONINI, MYOGLOBIN in the last 168 hours.  Invalid input(s): CK ------------------------------------------------------------------------------------------------------------------ Invalid input(s): POCBNP     Time Spent in minutes  35   Reyes Aldaco K M.D on 02/02/2014 at 10:21 AM  Between 7am to 7pm - Pager - [phone]  After 7pm go to [email or web address] - password  [email or web address]  And look for the night coverage person covering for me after hours  [facility] Office  [phone]

## 2014-02-03 ENCOUNTER — Inpatient Hospital Stay (HOSPITAL_COMMUNITY): Payer: Medicare Other

## 2014-02-03 DIAGNOSIS — L03116 Cellulitis of left lower limb: Secondary | ICD-10-CM

## 2014-02-03 DIAGNOSIS — M7989 Other specified soft tissue disorders: Secondary | ICD-10-CM

## 2014-02-03 LAB — BASIC METABOLIC PANEL
ANION GAP: 13 (ref 5–15)
BUN: 20 mg/dL (ref 6–23)
CHLORIDE: 103 meq/L (ref 96–112)
CO2: 26 meq/L (ref 19–32)
Calcium: 8.3 mg/dL — ABNORMAL LOW (ref 8.4–10.5)
Creatinine, Ser: 0.91 mg/dL (ref 0.50–1.10)
GFR calc Af Amer: 71 mL/min — ABNORMAL LOW (ref >90)
GFR calc non Af Amer: 62 mL/min — ABNORMAL LOW (ref >90)
Glucose, Bld: 110 mg/dL — ABNORMAL HIGH (ref 70–99)
POTASSIUM: 3.6 meq/L — AB (ref 3.7–5.3)
Sodium: 142 mEq/L (ref 137–147)

## 2014-02-03 LAB — CBC
HCT: 34.3 % — ABNORMAL LOW (ref 36.0–46.0)
HEMOGLOBIN: 10.9 g/dL — AB (ref 12.0–15.0)
MCH: 28.2 pg (ref 26.0–34.0)
MCHC: 31.8 g/dL (ref 30.0–36.0)
MCV: 88.6 fL (ref 78.0–100.0)
Platelets: 274 10*3/uL (ref 150–400)
RBC: 3.87 MIL/uL (ref 3.87–5.11)
RDW: 14.3 % (ref 11.5–15.5)
WBC: 8 10*3/uL (ref 4.0–10.5)

## 2014-02-03 LAB — MAGNESIUM: Magnesium: 2.4 mg/dL (ref 1.5–2.5)

## 2014-02-03 MED ORDER — ONDANSETRON HCL 4 MG/2ML IJ SOLN: 4.0000 mg | Freq: Four times a day (QID) | INTRAMUSCULAR | Status: DC

## 2014-02-03 MED ORDER — SODIUM CHLORIDE 0.9 % IV SOLN
INTRAVENOUS | Status: DC
  Administered 2014-02-03: 17:00:00 via INTRAVENOUS

## 2014-02-03 MED ORDER — SENNOSIDES-DOCUSATE SODIUM 8.6-50 MG PO TABS
2.0000 | ORAL_TABLET | Freq: Two times a day (BID) | ORAL | Status: DC
  Administered 2014-02-03 – 2014-02-11 (×9): 2 via ORAL
  Filled 2014-02-03 (×10): qty 2

## 2014-02-03 MED ORDER — ACETAMINOPHEN 325 MG PO TABS: 650.0000 mg | ORAL_TABLET | Freq: Four times a day (QID) | ORAL | Status: DC | PRN

## 2014-02-03 MED ORDER — ONDANSETRON HCL 4 MG/2ML IJ SOLN
4.0000 mg | Freq: Three times a day (TID) | INTRAMUSCULAR | Status: DC
  Administered 2014-02-03 – 2014-02-05 (×8): 4 mg via INTRAVENOUS
  Filled 2014-02-03 (×9): qty 2

## 2014-02-03 MED ORDER — PHENOL 1.4 % MT LIQD: 1.0000 | OROMUCOSAL | Status: DC | PRN

## 2014-02-03 MED ORDER — POTASSIUM CHLORIDE CRYS ER 20 MEQ PO TBCR
40.0000 meq | EXTENDED_RELEASE_TABLET | Freq: Once | ORAL | Status: AC
  Administered 2014-02-10: 40 meq via ORAL
  Filled 2014-02-03: qty 2

## 2014-02-03 NOTE — Progress Notes (Signed)
Utilization review completed.  

## 2014-02-03 NOTE — Evaluation (Signed)
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 Pt denies any known triggering factors but explains she was recently started on Macrobid for UTI and it seems to her, nausea started right after she was started on the medication. She also reports upper abd quadrants discomfort; low grade fever 99.5 F, physical exam fairly unremarkable but right foot erythema noted and findings consistent with cellulitis.   Clinical Impression   Pt admitted with above. Pt currently with functional limitations due to the deficits listed below (see PT Problem List).  Pt will benefit from skilled PT to increase their independence and safety with mobility to allow discharge to the venue listed below.       Follow Up Recommendations Outpatient PT;Supervision/Assistance - 24 hour  The potential need for Outpatient PT can be addressed at Ortho follow-up appointments.     Equipment Recommendations  None recommended by PT    Recommendations for Other Services       Precautions / Restrictions Precautions Precautions: None Restrictions Weight Bearing Restrictions: Yes RLE Weight Bearing: Weight bearing as tolerated      Mobility  Bed Mobility Overal bed mobility: Needs Assistance Bed Mobility: Sit to Supine       Sit to supine: Supervision;Min assist   General bed mobility comments: Cues for best positioning in bed; did not need asist to get LEs into bed; Min assist to scoot to [facility]  Transfers Overall transfer level: Needs assistance Equipment used:  (pulled to stand from toilet with grab bar) Transfers: Sit to/from Stand Sit to Stand: Supervision         General transfer comment: Overall  managing transfers well  Ambulation/Gait Ambulation/Gait assistance: Supervision Ambulation Distance (Feet): 15 Feet (bathroom back to bed) Assistive device: None Gait Pattern/deviations:  (increase lateral translation of center of mass) Gait velocity: slowed   General Gait Details: Moving slowly, but no loss of balance; General malaise limiting activity tolerance  Stairs            Wheelchair Mobility    Modified Rankin (Stroke Patients Only)       Balance Overall balance assessment: Needs assistance           Standing balance-Leahy Scale: Fair                               Pertinent Vitals/Pain Pain Assessment: 0-10 Pain Score: 10-Worst pain ever Pain Location: abdomen Pain Descriptors / Indicators: Aching Pain Intervention(s): Limited activity within patient's tolerance;Repositioned    Home Living Family/patient expects to be discharged to:: Private residence Living Arrangements: Spouse/significant other Available Help at Discharge: Family;Available 24 hours/day Type of Home: House Home Access: Stairs to enter Entrance Stairs-Rails: Right;Left;Can reach both Entrance Stairs-Number of Steps: 3 Home Layout: Multi-level;Able to live on main level with bedroom/bathroom Home Equipment: Walker - 2 wheels Additional Comments: pt reports she has "handicapped height" toilet    Prior Function Level of Independence: Independent               Hand Dominance        Extremity/Trunk Assessment   Upper Extremity Assessment: Overall WFL for tasks assessed  Lower Extremity Assessment: Generalized weakness (Uses UEs on grab bars to pull to stand from toilet)      Cervical / Trunk Assessment: Normal  Communication   Communication: No difficulties  Cognition Arousal/Alertness: Awake/alert Behavior During Therapy: WFL for tasks assessed/performed Overall Cognitive Status: Within Functional Limits for tasks assessed                       General Comments      Exercises        Assessment/Plan    PT Assessment Patient needs continued PT services  PT Diagnosis Generalized weakness;Acute pain   PT Problem List Decreased strength;Decreased activity tolerance;Decreased mobility  PT Treatment Interventions DME instruction;Gait training;Stair training;Functional mobility training;Therapeutic activities;Therapeutic exercise;Patient/family education   PT Goals (Current goals can be found in the Care Plan section) Acute Rehab PT Goals Patient Stated Goal: wants to feel better PT Goal Formulation: With patient Time For Goal Achievement: 02/17/14 Potential to Achieve Goals: Good    Frequency Min 3X/week   Barriers to discharge        Co-evaluation               End of Session   Activity Tolerance: Patient limited by fatigue (limited by malaise) Patient left: in bed;with call bell/phone within reach;with family/visitor present           Time: [id number] PT Time Calculation (min): 10 min   Charges:   PT Evaluation $Initial PT Evaluation Tier I: 1 Procedure     PT G CodesOlen Pel:          Rhapsody Wolven Hamff 02/03/2014, 4:26 PM  Van ClinesHolly Corita Allinson, PT  Acute Rehabilitation Services Pager [phone] Office [phone]

## 2014-02-03 NOTE — Progress Notes (Signed)
PT Cancellation Note  Patient Details Name: Sheri JensenGwynn D Mccoy MRN: [id number] DOB: 1941-03-05   Cancelled Treatment:    Reason Eval/Treat Not Completed: Medical issues which prohibited therapy.  Awaiting doppler to r/o DVT.  Pt currently off of floor for test due to abd pain/nausea. Will check back as able to see if results are negative for DVT to perform PT eval.   Lashanti Chambless LUBECK 02/03/2014, 9:39 AM

## 2014-02-03 NOTE — Progress Notes (Addendum)
PATIENT DETAILS Name: Sheri Mccoy Age: 73 y.o. Sex: female Date of Birth: [birth year] Admit Date: 02/01/2014 Admitting Physician Alison MurrayAlma M Devine, Sheri Mccoy VHQ:IONGEX,BMWUXLKPCP:NYLAND,Sheri Molly MaduroOBERT, Sheri Mccoy  Subjective: Claims to be nauseous. No vomiting. Small BM on 02/02/14  Assessment/Plan: Principal Problem: Nausea/Vomiting:likely secondary to Ileus-?etiology, slowly improving. Belly soft, not tender/distended. Try scheduled Zofran for today, ambulate, start clears and follow.Stop all narcotics! No vomiting, but still with nausea.  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 Not clearly able to tell me whether food is "going down"-will consult Dr Nicoletta BaHayes-who she has seen in the past as well. Not sure this level of retching/dry heaving can be attributed to a mild ileus.Could have ?underlying motility disorder-Spoke with Dr Marva PandaHayes-keep NPO post midnight, he will evaluate in am  Active Problems:   Minimal Right Leg Cellulitis:significantly improved, per patient-she had chronic mild erythema, leg back to baseline. Stop Zosyn, continue IV Vanco, once diet improved, will transition to oral Abx.   Hyponatremia:resolved, secondary to above   Hypokalemia:resolved, secondary to above   HTN (hypertension):controlled with Amlodipine, Micardis/HCTZ on hold.   HLD (hyperlipidemia):c/w statins  Disposition: Remain inpatient  Antibiotics:  IV Vancomycin 10/31>>  IV Zosyn 10/31>>11/2  DVT Prophylaxis: Prophylactic Lovenox   Code Status: Full code   Family Communication Spouse at bedside Son-Chris at bedside  Procedures:  None  CONSULTS:  None  MEDICATIONS: Scheduled Meds: . amLODipine  10 mg Oral Daily  . aspirin EC  81 mg Oral Daily  . atorvastatin  40 mg Oral Daily  . enoxaparin (LOVENOX) injection  50 mg  Subcutaneous Daily  . ondansetron (ZOFRAN) IV  4 mg Intravenous TID AC & HS  . pantoprazole (PROTONIX) IV  40 mg Intravenous Q12H  . pneumococcal 23 valent vaccine  0.5 mL Intramuscular Tomorrow-1000  . senna-docusate  2 tablet Oral BID  . vancomycin  1,000 mg Intravenous Q12H   Continuous Infusions:  PRN Meds:.acetaminophen, phenol, promethazine  Antibiotics: Anti-infectives    Start     Dose/Rate Route Frequency Ordered Stop   02/02/14 2100  piperacillin-tazobactam (ZOSYN) IVPB 3.375 g  Status:  Discontinued     3.375 g12.5 mL/hr over 240 Minutes Intravenous 3 times per day 02/02/14 1747 02/03/14 1034   02/02/14 0800  vancomycin (VANCOCIN) IVPB 1000 mg/200 mL premix     1,000 mg200 mL/hr over 60 Minutes Intravenous Every 12 hours 02/01/14 2336     02/01/14 2359  piperacillin-tazobactam (ZOSYN) IVPB 3.375 g  Status:  Discontinued     3.375 g12.5 mL/hr over 240 Minutes Intravenous 3 times per day 02/01/14 2336 02/02/14 1747   02/01/14 2345  vancomycin (VANCOCIN) 1,500 mg in sodium chloride 0.9 % 500 mL IVPB     1,500 mg250 mL/hr over 120 Minutes Intravenous  Once 02/01/14 2336 02/02/14 0145   02/01/14 2330  piperacillin-tazobactam (ZOSYN) IVPB 3.375 g  Status:  Discontinued     3.375 g100 mL/hr over 30 Minutes Intravenous  Once 02/01/14 2328 02/01/14 2332   02/01/14 2330  vancomycin (VANCOCIN) IVPB 1000 mg/200 mL premix  Status:  Discontinued     1,000 mg200 mL/hr over 60 Minutes Intravenous  Once 02/01/14 2328 02/01/14 2332   02/01/14 1700  clindamycin (CLEOCIN) IVPB 600 mg     600 mg100 mL/hr over 30 Minutes Intravenous  Once 02/01/14 1658 02/01/14 1757  PHYSICAL EXAM: Vital signs in last 24 hours: Filed Vitals:   02/02/14 1546 02/02/14 1933 02/02/14 2114 02/03/14 0535  BP: 169/86 179/85 172/87 156/90  Pulse: 93 85  84  Temp: 98.3 F (36.8 C) 98.8 F (37.1 C)  98.4 F (36.9 C)  TempSrc: Oral Oral  Oral  Resp: 16 16  18   Height:      Weight:      SpO2: 97% 97%  97%     Weight change: 0.6 kg (1 lb 5.2 oz) Filed Weights   02/01/14 2318 02/02/14 0630 02/02/14 0734  Weight: 98 kg (216 lb 0.8 oz) 96.2 kg (212 lb 1.3 oz) 98.6 kg (217 lb 6 oz)   Body mass index is 37.29 kg/(m^2).   Gen Exam: Awake and alert with clear speech.   Neck: Supple, No JVD.   Chest: B/L Clear.   CVS: S1 S2 Regular, no murmurs.  Abdomen: soft, BS +, non tender, non distended.  Extremities: no edema, lower extremities warm to touch. Neurologic: Non Focal.   Skin: No Rash.   Wounds: N/A.    Intake/Output from previous day:  Intake/Output Summary (Last 24 hours) at 02/03/14 1040 Last data filed at 02/03/14 0536  Gross per 24 hour  Intake 1627.5 ml  Output      2 ml  Net 1625.5 ml     LAB RESULTS: CBC  Recent Labs Lab 02/01/14 1455 02/02/14 0619 02/03/14 0540  WBC 9.9 5.7 8.0  HGB 11.6* 10.1* 10.9*  HCT 35.9* 31.6* 34.3*  PLT 274 221 274  MCV 87.6 88.0 88.6  MCH 28.3 28.1 28.2  MCHC 32.3 32.0 31.8  RDW 14.1 14.3 14.3  LYMPHSABS 0.4*  --   --   MONOABS 0.2  --   --   EOSABS 0.0  --   --   BASOSABS 0.0  --   --     Chemistries   Recent Labs Lab 02/01/14 1455 02/02/14 0619 02/03/14 0540  NA 131* 139 142  K 3.0* 3.2* 3.6*  CL 90* 101 103  CO2 25 27 26   GLUCOSE 145* 109* 110*  BUN 20 23 20   CREATININE 0.64 0.70 0.91  CALCIUM 8.7 8.1* 8.3*  MG  --   --  2.4    CBG: No results for input(s): GLUCAP in the last 168 hours.  GFR Estimated Creatinine Clearance: 63.8 mL/min (by C-G formula based on Cr of 0.91).  Coagulation profile No results for input(s): INR, PROTIME in the last 168 hours.  Cardiac Enzymes No results for input(s): CKMB, TROPONINI, MYOGLOBIN in the last 168 hours.  Invalid input(s): CK  Invalid input(s): POCBNP No results for input(s): DDIMER in the last 72 hours. No results for input(s): HGBA1C in the last 72 hours. No results for input(s): CHOL, HDL, LDLCALC, TRIG, CHOLHDL, LDLDIRECT in the last 72 hours. No results for  input(s): TSH, T4TOTAL, T3FREE, THYROIDAB in the last 72 hours.  Invalid input(s): FREET3 No results for input(s): VITAMINB12, FOLATE, FERRITIN, TIBC, IRON, RETICCTPCT in the last 72 hours.  Recent Labs  02/01/14 1455  LIPASE 13    Urine Studies No results for input(s): UHGB, CRYS in the last 72 hours.  Invalid input(s): UACOL, UAPR, USPG, UPH, UTP, UGL, UKET, UBIL, UNIT, UROB, ULEU, UEPI, UWBC, URBC, UBAC, CAST, UCOM, BILUA  MICROBIOLOGY: Recent Results (from the past 240 hour(s))  Urine culture     Status: None   Collection Time: 02/01/14  2:56 PM  Result Value Ref Range Status  Specimen Description URINE, CLEAN CATCH  Final   Special Requests NONE  Final   Culture  Setup Time   Final    02/01/2014 23:54 Performed at [facility]    Colony Count   Final    20,OOO COLONIES/ML Performed at [facility]    Culture   Final    Multiple bacterial morphotypes present, none predominant. Suggest appropriate recollection if clinically indicated. Performed at [facility]    Report Status 02/02/2014 FINAL  Final    RADIOLOGY STUDIES/RESULTS: Dg Chest 2 View  02/01/2014   CLINICAL DATA:  Cough.  EXAM: CHEST  2 VIEW  COMPARISON:  12/12/2013  FINDINGS: Borderline cardiomegaly remains stable. Mild bibasilar scarring is also unchanged. No evidence of pulmonary infiltrate or edema. No evidence of pleural effusion.  IMPRESSION: Stable exam.  No active disease.   Electronically Signed   By: Myles RosenthalJohn  Stahl M.D.   On: 02/01/2014 15:40   Dg Abd 2 Views  02/03/2014   CLINICAL DATA:  Vomiting.  EXAM: ABDOMEN - 2 VIEW  COMPARISON:  Same day.  FINDINGS: The bowel gas pattern is normal. Status post cholecystectomy. There is no evidence of free air. No radio-opaque calculi or other significant radiographic abnormality is seen.  IMPRESSION: Bowel distention noted on prior exam has resolved. There is no evidence of bowel obstruction or ileus.   Electronically Signed   By: Roque LiasJames   Green M.D.   On: 02/03/2014 10:21   Dg Abd Portable 1v  02/02/2014   CLINICAL DATA:  Abdominal pain.  EXAM: PORTABLE ABDOMEN - 1 VIEW  COMPARISON:  No prior .  FINDINGS: Soft tissue structures are unremarkable. Slightly distended air-filled loops of small large bowel noted. These findings most consistent with adynamic ileus. Surgical clips right upper quadrant consistent with cholecystectomy. Left hip replacement. Pelvic phleboliths. Degenerative changes lumbar spine right hip.  IMPRESSION: Findings most consistent with mild adynamic ileus.   Electronically Signed   By: Maisie Fushomas  Register   On: 02/02/2014 08:31    Jeoffrey MassedGHIMIRE,Kaelee Pfeffer, Sheri Mccoy  [facility] Pager:[phone]  If 7PM-7AM, please contact night-coverage [email or web address] Password [email or web address] 02/03/2014, 10:40 AM   LOS: 2 days

## 2014-02-03 NOTE — Progress Notes (Signed)
VASCULAR LAB PRELIMINARY  PRELIMINARY  PRELIMINARY  PRELIMINARY  Right lower extremity venous duplex completed.    Preliminary report:  Right:  No evidence of DVT, superficial thrombosis, or Baker's cyst.  Greene Diodato, RVS 02/03/2014, 3:00 PM

## 2014-02-04 ENCOUNTER — Encounter (HOSPITAL_COMMUNITY)
Admission: EM | Disposition: A | Discharge status: Home / Self Care | Payer: Self-pay | Source: Home / Self Care | Attending: Internal Medicine

## 2014-02-04 ENCOUNTER — Inpatient Hospital Stay (HOSPITAL_COMMUNITY): Payer: Medicare Other

## 2014-02-04 ENCOUNTER — Encounter (HOSPITAL_COMMUNITY): Payer: Self-pay

## 2014-02-04 HISTORY — PX: ESOPHAGOGASTRODUODENOSCOPY: SHX5428

## 2014-02-04 LAB — BASIC METABOLIC PANEL
ANION GAP: 15 (ref 5–15)
BUN: 15 mg/dL (ref 6–23)
CHLORIDE: 97 meq/L (ref 96–112)
CO2: 28 meq/L (ref 19–32)
Calcium: 8.7 mg/dL (ref 8.4–10.5)
Creatinine, Ser: 1.27 mg/dL — ABNORMAL HIGH (ref 0.50–1.10)
GFR calc Af Amer: 48 mL/min — ABNORMAL LOW (ref >90)
GFR calc non Af Amer: 41 mL/min — ABNORMAL LOW (ref >90)
Glucose, Bld: 108 mg/dL — ABNORMAL HIGH (ref 70–99)
Potassium: 2.6 mEq/L — CL (ref 3.7–5.3)
SODIUM: 140 meq/L (ref 137–147)

## 2014-02-04 SURGERY — EGD (ESOPHAGOGASTRODUODENOSCOPY)
Anesthesia: Moderate Sedation | Laterality: Left

## 2014-02-04 MED ORDER — MIDAZOLAM HCL 5 MG/ML IJ SOLN
INTRAMUSCULAR | Status: AC
  Filled 2014-02-04: qty 2

## 2014-02-04 MED ORDER — MORPHINE SULFATE 2 MG/ML IJ SOLN
0.5000 mg | INTRAMUSCULAR | Status: DC | PRN
  Administered 2014-02-04 – 2014-02-05 (×3): 1 mg via INTRAVENOUS
  Filled 2014-02-04 (×3): qty 1

## 2014-02-04 MED ORDER — FENTANYL CITRATE 0.05 MG/ML IJ SOLN
INTRAMUSCULAR | Status: AC
  Filled 2014-02-04: qty 2

## 2014-02-04 MED ORDER — MIDAZOLAM HCL 10 MG/2ML IJ SOLN
INTRAMUSCULAR | Status: DC | PRN
  Administered 2014-02-04 (×2): 2 mg via INTRAVENOUS
  Administered 2014-02-04: 1 mg via INTRAVENOUS

## 2014-02-04 MED ORDER — POTASSIUM CHLORIDE 10 MEQ/100ML IV SOLN
10.0000 meq | INTRAVENOUS | Status: AC
  Administered 2014-02-04 (×4): 10 meq via INTRAVENOUS
  Filled 2014-02-04 (×4): qty 100

## 2014-02-04 MED ORDER — FENTANYL CITRATE 0.05 MG/ML IJ SOLN
INTRAMUSCULAR | Status: DC | PRN
  Administered 2014-02-04 (×2): 25 ug via INTRAVENOUS

## 2014-02-04 MED ORDER — ONDANSETRON HCL 4 MG/2ML IJ SOLN
INTRAMUSCULAR | Status: AC
  Filled 2014-02-04: qty 2

## 2014-02-04 MED ORDER — SODIUM CHLORIDE 0.9 % IV SOLN: INTRAVENOUS | Status: DC

## 2014-02-04 NOTE — Progress Notes (Signed)
Cramping

## 2014-02-04 NOTE — Interval H&P Note (Signed)
History and Physical Interval Note:  02/04/2014 1:40 PM  Sheri Mccoy  has presented today for surgery, with the diagnosis of nausea vomiting  The various methods of treatment have been discussed with the patient and family. After consideration of risks, benefits and other options for treatment, the patient has consented to  Procedure(s) with comments: ESOPHAGOGASTRODUODENOSCOPY (EGD) (Left) - possible savary dilatation as a surgical intervention .  The patient's history has been reviewed, patient examined, no change in status, stable for surgery.  I have reviewed the patient's chart and labs.  Questions were answered to the patient's satisfaction.     Connie Lasater C

## 2014-02-04 NOTE — Progress Notes (Signed)
EGD done. It appears that there is a recurrence of her hiatal hernia with somewhat unusual appearance, cannot rule out paraesophageal component. No definite stricture no food in stomach no gastric outlet obstruction. We'll obtain barium swallow to better delineate esophagogastric anatomy in light of her symptoms

## 2014-02-04 NOTE — Plan of Care (Signed)
Problem: Consults Goal: General Medical Patient Education See Patient Education Module for specific education.  Outcome: Progressing Goal: Skin Care Protocol Initiated - if Braden Score 18 or less If consults are not indicated, leave blank or document N/A  Outcome: Progressing Goal: Nutrition Consult-if indicated Outcome: Progressing Goal: Diabetes Guidelines if Diabetic/Glucose > 140 If diabetic or lab glucose is > 140 mg/dl - Initiate Diabetes/Hyperglycemia Guidelines & Document Interventions  Outcome: Progressing  Problem: Phase II Progression Outcomes Goal: Progress activity as tolerated unless otherwise ordered Outcome: Progressing Goal: Discharge plan established Outcome: Progressing Goal: Vital signs remain stable Outcome: Progressing Goal: IV changed to normal saline lock Outcome: Progressing Goal: Obtain order to discontinue catheter if appropriate Outcome: Progressing Goal: Other Phase II Outcomes/Goals Outcome: Progressing  Problem: Phase III Progression Outcomes Goal: Pain controlled on oral analgesia Outcome: Progressing Goal: Activity at appropriate level-compared to baseline (UP IN CHAIR FOR HEMODIALYSIS)  Outcome: Progressing Goal: Voiding independently Outcome: Progressing Goal: IV/normal saline lock discontinued Outcome: Progressing Goal: Foley discontinued Outcome: Progressing Goal: Discharge plan remains appropriate-arrangements made Outcome: Progressing Goal: Other Phase III Outcomes/Goals Outcome: Progressing  Problem: Discharge Progression Outcomes Goal: Discharge plan in place and appropriate Outcome: Progressing Goal: Pain controlled with appropriate interventions Outcome: Progressing Goal: Hemodynamically stable Outcome: Progressing Goal: Complications resolved/controlled Outcome: Progressing Goal: Tolerating diet Outcome: Progressing Goal: Activity appropriate for discharge plan Outcome: Progressing Goal: Other Discharge  Outcomes/Goals Outcome: Progressing

## 2014-02-04 NOTE — Progress Notes (Signed)
PT Cancellation Note  Patient Details Name: Lacey JensenGwynn D Baumgardner MRN: [id number] DOB: [birth year]   Cancelled Treatment:    Reason Eval/Treat Not Completed: Patient at procedure or test/unavailable   Currently at Endoscopy;  Will follow up for PT tomorrow;   Thank you,  Van ClinesHolly Dyanara Cozza, PT  Acute Rehabilitation Services Pager [phone] Office [phone]     Van ClinesGarrigan, Rykar Lebleu Hamff 02/04/2014, 1:50 PM

## 2014-02-04 NOTE — Progress Notes (Signed)
Patient scheduled for upper EGD at 1:30 today

## 2014-02-04 NOTE — H&P (View-Only) (Signed)
[facility] Consult Note  Referring Provider: No ref. provider found Primary Care Physician:  NYLAND,LEONARD ROBERT, MD Primary Gastroenterologist:  Dr.  Chief Complaint: nausea vomiting and epigastric pain HPI: Sheri Mccoy is an 73 y.o. white  female  Who presents with nausea and epigastric pain dry heaves and inability to vomit due to previous Nissen fundoplication. She states this is happening intermittently. I saw her and did an EGD prior to her referral for Nissen fundoplication. She states these symptoms occur intermittently. She has no problems with her bowel movements.we had set her up for an endoscopy this morning but she had a low potassium of 2.6.  Past Medical History  Diagnosis Date  . Hypertension   . GERD (gastroesophageal reflux disease)   . Arthritis     Past Surgical History  Procedure Laterality Date  . Tonsillectomy    . Joint replacement Left   . Cesarean section    . Total knee arthroplasty Right 12/20/2013    Procedure: RIGHT TOTAL KNEE ARTHROPLASTY;  Surgeon: Steven R Norris, MD;  Location: [facility] OR;  Service: Orthopedics;  Laterality: Right;    Medications Prior to Admission  Medication Sig Dispense Refill  . aspirin 81 MG tablet Take 81 mg by mouth daily.    . atorvastatin (LIPITOR) 40 MG tablet Take 40 mg by mouth daily.    . esomeprazole (NEXIUM) 40 MG capsule Take 40 mg by mouth daily at 12 noon.    . meloxicam (MOBIC) 7.5 MG tablet Take 7.5 mg by mouth 2 (two) times daily.    . methocarbamol (ROBAXIN) 500 MG tablet Take 1 tablet (500 mg total) by mouth 3 (three) times daily as needed. 60 tablet 1  . ondansetron (ZOFRAN) 4 MG tablet Take 1 tablet (4 mg total) by mouth every 8 (eight) hours as needed for nausea or vomiting. 60 tablet 0  . potassium chloride SA (K-DUR,KLOR-CON) 20 MEQ tablet Take 10 mEq by mouth daily.    . telmisartan-hydrochlorothiazide (MICARDIS HCT) 40-12.5 MG per tablet Take 1.5 tablets by mouth daily.      Allergies:  No Known Allergies  History reviewed. No pertinent family history.  Social History:  reports that she has never smoked. She does not have any smokeless tobacco history on file. She reports that she does not drink alcohol or use illicit drugs.  Review of Systems: negative except as above   Blood pressure 166/77, pulse 70, temperature 98.6 F (37 C), temperature source Oral, resp. rate 18, height 5' 4" (1.626 m), weight 96.3 kg (212 lb 4.9 oz), SpO2 97 %. Head: Normocephalic, without obvious abnormality, atraumatic Neck: no adenopathy, no carotid bruit, no JVD, supple, symmetrical, trachea midline and thyroid not enlarged, symmetric, no tenderness/mass/nodules Resp: clear to auscultation bilaterally Cardio: regular rate and rhythm, S1, S2 normal, no murmur, click, rub or gallop GI: abdomen soft slightly distended with normoactive bowel sounds. [place] mass or guarding Extremities: extremities normal, atraumatic, no cyanosis or edema  Results for orders placed or performed during the hospital encounter of 02/01/14 (from the past 48 hour(s))  Basic metabolic panel     Status: Abnormal   Collection Time: 02/03/14  5:40 AM  Result Value Ref Range   Sodium 142 137 - 147 mEq/L   Potassium 3.6 (L) 3.7 - 5.3 mEq/L   Chloride 103 96 - 112 mEq/L   CO2 26 19 - 32 mEq/L   Glucose, Bld 110 (H) 70 - 99 mg/dL   BUN 20 6 - 23 mg/dL     Creatinine, Ser 0.91 0.50 - 1.10 mg/dL   Calcium 8.3 (L) 8.4 - 10.5 mg/dL   GFR calc non Af Amer 62 (L) >90 mL/min   GFR calc Af Amer 71 (L) >90 mL/min    Comment: (NOTE) The eGFR has been calculated using the CKD EPI equation. This calculation has not been validated in all clinical situations. eGFR's persistently <90 mL/min signify possible Chronic Kidney Disease.    Anion gap 13 5 - 15  CBC     Status: Abnormal   Collection Time: 02/03/14  5:40 AM  Result Value Ref Range   WBC 8.0 4.0 - 10.5 K/uL   RBC 3.87 3.87 - 5.11 MIL/uL   Hemoglobin 10.9 (L) 12.0 - 15.0  g/dL   HCT 34.3 (L) 36.0 - 46.0 %   MCV 88.6 78.0 - 100.0 fL   MCH 28.2 26.0 - 34.0 pg   MCHC 31.8 30.0 - 36.0 g/dL   RDW 14.3 11.5 - 15.5 %   Platelets 274 150 - 400 K/uL  Magnesium     Status: None   Collection Time: 02/03/14  5:40 AM  Result Value Ref Range   Magnesium 2.4 1.5 - 2.5 mg/dL   Dg Abd 2 Views  02/03/2014   CLINICAL DATA:  Vomiting.  EXAM: ABDOMEN - 2 VIEW  COMPARISON:  Same day.  FINDINGS: The bowel gas pattern is normal. Status post cholecystectomy. There is no evidence of free air. No radio-opaque calculi or other significant radiographic abnormality is seen.  IMPRESSION: Bowel distention noted on prior exam has resolved. There is no evidence of bowel obstruction or ileus.   Electronically Signed   By: James  Green M.D.   On: 02/03/2014 10:21   Dg Abd Portable 1v  02/02/2014   CLINICAL DATA:  Abdominal pain.  EXAM: PORTABLE ABDOMEN - 1 VIEW  COMPARISON:  No prior .  FINDINGS: Soft tissue structures are unremarkable. Slightly distended air-filled loops of small large bowel noted. These findings most consistent with adynamic ileus. Surgical clips right upper quadrant consistent with cholecystectomy. Left hip replacement. Pelvic phleboliths. Degenerative changes lumbar spine right hip.  IMPRESSION: Findings most consistent with mild adynamic ileus.   Electronically Signed   By: Thomas  Register   On: 02/02/2014 08:31    Assessment: Upper GI tract symptoms unclear whether nausea and dry heaving, dysphagia reflux or epigastric abdominal pain, rule out acid peptic disease, gastric outlet obstruction etc. Plan:  Plan EGD today after her potassium of 2.6 is been corrected with 4 runs of 10 mEq of potassium.  Kaya Klausing C 02/04/2014, 7:09 AM    

## 2014-02-04 NOTE — Op Note (Signed)
[facility] [address] [place], [id number]   ENDOSCOPY PROCEDURE REPORT  PATIENT: Sheri Mccoy, Sheri Mccoy  MR#: [id number] BIRTHDATE: 12/21/1940 , 73  yrs. old GENDER: female ENDOSCOPIST: Dorena CookeyJohn Eliani Leclere, MD REFERRED BY: PROCEDURE DATE:  02/04/2014 PROCEDURE: ASA CLASS: INDICATIONS:  nausea vomiting and regurgitation MEDICATIONS: 50 g fentanyl, 4 mg Versed TOPICAL ANESTHETIC:  DESCRIPTION OF PROCEDURE: After the risks benefits and alternatives of the procedure were thoroughly explained, informed consent was obtained.  The Pentax Gastroscope [id number] endoscope was introduced through the mouth and advanced to the second portion of the duodenum , Without limitations.  The instrument was slowly withdrawn as the mucosa was fully examined.    esophagus: There appeared to be a significant length of hiatal hernia possibly 6-8 cm despite her previous Nissen fundoplication. There was some dilatation of one aspect ofdistal the esophagus did not look like a true diverticulum. the diaphragmatic hiatus appeared to be at about 40 cm and the squamocolumnar line at about 32 cm. There was no visible esophagitis esophageal ulcer or definite stricture Stomach normal other than evidence of previous fundoplication and hiatal hernia, retroflex view somewhat difficult.  Pylorus appeared normal. No retained food in the stomach. Duodenum appeared normal.             The scope was then withdrawn from the patient and the procedure completed.  COMPLICATIONS: There were no immediate complications.  ENDOSCOPIC IMPRESSION: apparent recurrence of hiatal hernia after fundoplication, cannot rule out torsion or possible paraesophageal component.  RECOMMENDATIONS: Will obtain barium swallow  REPEAT EXAM:  eSigned:  Dorena CookeyJohn Quinta Eimer, MD 02/04/2014 2:03 PM    CC:  CPT CODES: ICD CODES:  The ICD and CPT codes recommended by this software are interpretations from the data that the  clinical staff has captured with the software.  The verification of the translation of this report to the ICD and CPT codes and modifiers is the sole responsibility of the health care institution and practicing physician where this report was generated.  [employer] will not be held responsible for the validity of the ICD and CPT codes included on this report.  AMA assumes no liability for data contained or not contained herein. CPT is a Publishing rights managerregistered trademark of the [employer].

## 2014-02-04 NOTE — Progress Notes (Addendum)
PATIENT DETAILS Name: Sheri Mccoy Age: 73 y.o. Sex: female Date of Birth: 12/15/1940 Admit Date: 02/01/2014 Admitting Physician Alison MurrayAlma M Devine, MD ZOX:WRUEAV,[id number]:NYLAND,LEONARD Molly MaduroOBERT, MD  Brief Summary: 73 yo female with hx of HTN, Dyslipidemia, recent Right Knee Arthroplasty, recent UTI-admitted with several days hx of nausea-"dry heaving". She was found to have mild ileus of Abd Xray, mild cellulitis of her Right lower extremity. She was admitted for supportive care, IV Abx. Unfortunately, with patient having BM's and inspite of radiologic improvement no improvement in her symptoms. GI was subsequently consulted and underwent EGD (findings below). Now has new ARF and leukocytosis.   Subjective: Still with nausea/dry heaving. Developed worsening upper abd discomfort last evening.   Assessment/Plan: Principal Problem: Nausea/Vomiting:initially felt to be secondary to mild Ileus,however symptoms continuing in spite of radiological improvement and numerous bowel movements.Suspect more of a upper GI cause,? Gastroparesis. Patient has a significant history of reflux/hiatal hernia and underwent Nissen's fundoplication a few years back. Given this history, GI was consulted.Patient underwent endoscopy on 11/3, which showed recurrence of her hiatal hernia.Since continued to have symptoms, have ordered a Gastric Emptying study as well, currently pending. Also spoke with GI MD- Dr Madilyn FiremanHayes this am-who recommended to get a CT Abd/Chest (without contrast) as well. Will repeat LFT's, Lipase levels. She is s/p cholecystectomy. Note-her belly is soft, with mild epigastric tenderness. Unfortunately, etiology of her pain/nausea remains unclear at this time.If above studies unrevealing-?surgery eval.  Active Problems:   Leukocytosis: new this am-?stress margination-no site of infection apparent-her leg is at baseline (has chronic skin changes. UA not really suggestive of UTI, Urine cs positive for 20 K colonies of  multiple bacterial morphotypes. Blood culture 11/1 neg so far. Will check CT Abd/Chest, and panculture if febrile.    Minimal Right Leg Cellulitis:significantly improved, per patient-she had chronic mild erythema, leg back to baseline. Empirically started on both vancomycin and Zosyn on admission, Zosyn discontinued on 11/2, since hardly any erythema  Vancomycin stopped  on 11/3.Leg remains stable.   JYN:WGNFAOZARF:suspect secondary to pre-renal process, increase IVF to 150 cc/hr. Recent UA neg for proteinuria. If renal function continues to worsen, will get renal ultrasound and renal consultation.    Hyponatremia:resolved, secondary to above    Hypokalemia: secondary to above. Will replete and recheck in am.      HTN (hypertension):uncontrolled, continue with Amlodipine, start Coreg, add prn Hydralazine.Micardis/HCTZ on hold.    HLD (hyperlipidemia):will hold statins for now given worsening labs  Disposition: Remain inpatient  Antibiotics:  IV Vancomycin 10/31>>11/4  IV Zosyn 10/31>>11/2  DVT Prophylaxis: Prophylactic Lovenox   Code Status: Full code   Family Communication Spouse at bedside Son-Chris tel-[phone]  Procedures:  None  CONSULTS:  None  MEDICATIONS: Scheduled Meds: . amLODipine  10 mg Oral Daily  . aspirin EC  81 mg Oral Daily  . atorvastatin  40 mg Oral Daily  . enoxaparin (LOVENOX) injection  50 mg Subcutaneous Daily  . ondansetron (ZOFRAN) IV  4 mg Intravenous TID AC & HS  . pantoprazole (PROTONIX) IV  40 mg Intravenous Q12H  . pneumococcal 23 valent vaccine  0.5 mL Intramuscular Tomorrow-1000  . potassium chloride  10 mEq Intravenous Q1 Hr x 4  . potassium chloride  40 mEq Oral Once  . senna-docusate  2 tablet Oral BID  . vancomycin  1,000 mg Intravenous Q12H   Continuous Infusions: . sodium chloride 50 mL/hr at 02/03/14 1630   PRN Meds:.acetaminophen, phenol, promethazine  Antibiotics: Anti-infectives    Start     Dose/Rate Route Frequency  Ordered Stop   02/02/14 2100  piperacillin-tazobactam (ZOSYN) IVPB 3.375 g  Status:  Discontinued     3.375 g12.5 mL/hr over 240 Minutes Intravenous 3 times per day 02/02/14 1747 02/03/14 1034   02/02/14 0800  vancomycin (VANCOCIN) IVPB 1000 mg/200 mL premix     1,000 mg200 mL/hr over 60 Minutes Intravenous Every 12 hours 02/01/14 2336     02/01/14 2359  piperacillin-tazobactam (ZOSYN) IVPB 3.375 g  Status:  Discontinued     3.375 g12.5 mL/hr over 240 Minutes Intravenous 3 times per day 02/01/14 2336 02/02/14 1747   02/01/14 2345  vancomycin (VANCOCIN) 1,500 mg in sodium chloride 0.9 % 500 mL IVPB     1,500 mg250 mL/hr over 120 Minutes Intravenous  Once 02/01/14 2336 02/02/14 0145   02/01/14 2330  piperacillin-tazobactam (ZOSYN) IVPB 3.375 g  Status:  Discontinued     3.375 g100 mL/hr over 30 Minutes Intravenous  Once 02/01/14 2328 02/01/14 2332   02/01/14 2330  vancomycin (VANCOCIN) IVPB 1000 mg/200 mL premix  Status:  Discontinued     1,000 mg200 mL/hr over 60 Minutes Intravenous  Once 02/01/14 2328 02/01/14 2332   02/01/14 1700  clindamycin (CLEOCIN) IVPB 600 mg     600 mg100 mL/hr over 30 Minutes Intravenous  Once 02/01/14 1658 02/01/14 1757       PHYSICAL EXAM: Vital signs in last 24 hours: Filed Vitals:   02/03/14 0535 02/03/14 1530 02/04/14 0500 02/04/14 0602  BP: 156/90 156/71  166/77  Pulse: 84 86  70  Temp: 98.4 F (36.9 C) 98.2 F (36.8 C)  98.6 F (37 C)  TempSrc: Oral     Resp: 18 18    Height:      Weight:   96.3 kg (212 lb 4.9 oz)   SpO2: 97% 98%  97%    Weight change: -2.3 kg (-5 lb 1.1 oz) Filed Weights   02/02/14 0630 02/02/14 0734 02/04/14 0500  Weight: 96.2 kg (212 lb 1.3 oz) 98.6 kg (217 lb 6 oz) 96.3 kg (212 lb 4.9 oz)   Body mass index is 36.42 kg/(m^2).   Gen Exam: Awake and alert with clear speech. Ambulating from the bathroom when I walked in  Neck: Supple, No JVD.   Chest: B/L Clear.   CVS: S1 S2 Regular, no murmurs.  Abdomen: soft, BS +,  mildly tender epigastric area, non distended.  Extremities: no edema, lower extremities warm to touch.Mild chronic erythema to right leg. Neurologic: Non Focal.   Skin: No Rash.   Wounds: N/A.    Intake/Output from previous day:  Intake/Output Summary (Last 24 hours) at 02/04/14 1220 Last data filed at 02/03/14 2348  Gross per 24 hour  Intake    360 ml  Output      1 ml  Net    359 ml     LAB RESULTS: CBC  Recent Labs Lab 02/01/14 1455 02/02/14 0619 02/03/14 0540  WBC 9.9 5.7 8.0  HGB 11.6* 10.1* 10.9*  HCT 35.9* 31.6* 34.3*  PLT 274 221 274  MCV 87.6 88.0 88.6  MCH 28.3 28.1 28.2  MCHC 32.3 32.0 31.8  RDW 14.1 14.3 14.3  LYMPHSABS 0.4*  --   --   MONOABS 0.2  --   --   EOSABS 0.0  --   --   BASOSABS 0.0  --   --     Chemistries   Recent Labs Lab  02/01/14 1455 02/02/14 0619 02/03/14 0540 02/04/14 0621  NA 131* 139 142 140  K 3.0* 3.2* 3.6* 2.6*  CL 90* 101 103 97  CO2 25 27 26 28   GLUCOSE 145* 109* 110* 108*  BUN 20 23 20 15   CREATININE 0.64 0.70 0.91 1.27*  CALCIUM 8.7 8.1* 8.3* 8.7  MG  --   --  2.4  --     CBG: No results for input(s): GLUCAP in the last 168 hours.  GFR Estimated Creatinine Clearance: 45.1 mL/min (by C-G formula based on Cr of 1.27).  Coagulation profile No results for input(s): INR, PROTIME in the last 168 hours.  Cardiac Enzymes No results for input(s): CKMB, TROPONINI, MYOGLOBIN in the last 168 hours.  Invalid input(s): CK  Invalid input(s): POCBNP No results for input(s): DDIMER in the last 72 hours. No results for input(s): HGBA1C in the last 72 hours. No results for input(s): CHOL, HDL, LDLCALC, TRIG, CHOLHDL, LDLDIRECT in the last 72 hours. No results for input(s): TSH, T4TOTAL, T3FREE, THYROIDAB in the last 72 hours.  Invalid input(s): FREET3 No results for input(s): VITAMINB12, FOLATE, FERRITIN, TIBC, IRON, RETICCTPCT in the last 72 hours.  Recent Labs  02/01/14 1455  LIPASE 13    Urine Studies No  results for input(s): UHGB, CRYS in the last 72 hours.  Invalid input(s): UACOL, UAPR, USPG, UPH, UTP, UGL, UKET, UBIL, UNIT, UROB, ULEU, UEPI, UWBC, URBC, UBAC, CAST, UCOM, BILUA  MICROBIOLOGY: Recent Results (from the past 240 hour(s))  Urine culture     Status: None   Collection Time: 02/01/14  2:56 PM  Result Value Ref Range Status   Specimen Description URINE, CLEAN CATCH  Final   Special Requests NONE  Final   Culture  Setup Time   Final    02/01/2014 23:54 Performed at [facility]    Colony Count   Final    20,OOO COLONIES/ML Performed at [facility]    Culture   Final    Multiple bacterial morphotypes present, none predominant. Suggest appropriate recollection if clinically indicated. Performed at [facility]    Report Status 02/02/2014 FINAL  Final  Culture, blood (routine x 2)     Status: None (Preliminary result)   Collection Time: 02/02/14 12:43 AM  Result Value Ref Range Status   Specimen Description BLOOD RIGHT HAND  Final   Special Requests BOTTLES DRAWN AEROBIC AND ANAEROBIC 4CC EA  Final   Culture  Setup Time   Final    02/02/2014 18:00 Performed at [facility]    Culture   Final           BLOOD CULTURE RECEIVED NO GROWTH TO DATE CULTURE WILL BE HELD FOR 5 DAYS BEFORE ISSUING A FINAL NEGATIVE REPORT Performed at [facility]    Report Status PENDING  Incomplete  Culture, blood (routine x 2)     Status: None (Preliminary result)   Collection Time: 02/02/14 12:48 AM  Result Value Ref Range Status   Specimen Description BLOOD LEFT HAND  Final   Special Requests BOTTLES DRAWN AEROBIC AND ANAEROBIC 5CC EA  Final   Culture  Setup Time   Final    02/02/2014 18:00 Performed at [facility]    Culture   Final           BLOOD CULTURE RECEIVED NO GROWTH TO DATE CULTURE WILL BE HELD FOR 5 DAYS BEFORE ISSUING A FINAL NEGATIVE REPORT Performed at [facility]  Report Status PENDING  Incomplete     RADIOLOGY STUDIES/RESULTS: Dg Chest 2 View  02/01/2014   CLINICAL DATA:  Cough.  EXAM: CHEST  2 VIEW  COMPARISON:  12/12/2013  FINDINGS: Borderline cardiomegaly remains stable. Mild bibasilar scarring is also unchanged. No evidence of pulmonary infiltrate or edema. No evidence of pleural effusion.  IMPRESSION: Stable exam.  No active disease.   Electronically Signed   By: Myles Rosenthal M.D.   On: 02/01/2014 15:40   Dg Abd 2 Views  02/03/2014   CLINICAL DATA:  Vomiting.  EXAM: ABDOMEN - 2 VIEW  COMPARISON:  Same day.  FINDINGS: The bowel gas pattern is normal. Status post cholecystectomy. There is no evidence of free air. No radio-opaque calculi or other significant radiographic abnormality is seen.  IMPRESSION: Bowel distention noted on prior exam has resolved. There is no evidence of bowel obstruction or ileus.   Electronically Signed   By: Roque Lias M.D.   On: 02/03/2014 10:21   Dg Abd Portable 1v  02/02/2014   CLINICAL DATA:  Abdominal pain.  EXAM: PORTABLE ABDOMEN - 1 VIEW  COMPARISON:  No prior .  FINDINGS: Soft tissue structures are unremarkable. Slightly distended air-filled loops of small large bowel noted. These findings most consistent with adynamic ileus. Surgical clips right upper quadrant consistent with cholecystectomy. Left hip replacement. Pelvic phleboliths. Degenerative changes lumbar spine right hip.  IMPRESSION: Findings most consistent with mild adynamic ileus.   Electronically Signed   By: Maisie Fus  Register   On: 02/02/2014 08:31    Jeoffrey Massed, MD  [facility] Pager:[phone]  If 7PM-7AM, please contact night-coverage [email or web address] Password [email or web address] 02/04/2014, 12:20 PM   LOS: 3 days

## 2014-02-04 NOTE — Progress Notes (Signed)
Off  at this time

## 2014-02-04 NOTE — Consult Note (Signed)
[facility] Consult Note  Referring Provider: No ref. provider found Primary Care Physician:  Sherrie Mustache, MD Primary Gastroenterologist:  Dr.  Laurel Dimmer Complaint: nausea vomiting and epigastric pain HPI: Sheri Mccoy is an 73 y.o. white  female  Who presents with nausea and epigastric pain dry heaves and inability to vomit due to previous Nissen fundoplication. She states this is happening intermittently. I saw her and did an EGD prior to her referral for Nissen fundoplication. She states these symptoms occur intermittently. She has no problems with her bowel movements.we had set her up for an endoscopy this morning but she had a low potassium of 2.6.  Past Medical History  Diagnosis Date  . Hypertension   . GERD (gastroesophageal reflux disease)   . Arthritis     Past Surgical History  Procedure Laterality Date  . Tonsillectomy    . Joint replacement Left   . Cesarean section    . 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 aspirin 81 MG tablet Take 81 mg by mouth daily.    Marland Kitchen atorvastatin (LIPITOR) 40 MG tablet Take 40 mg by mouth daily.    Marland Kitchen esomeprazole (NEXIUM) 40 MG capsule Take 40 mg by mouth daily at 12 noon.    . meloxicam (MOBIC) 7.5 MG tablet Take 7.5 mg by mouth 2 (two) times daily.    . methocarbamol (ROBAXIN) 500 MG tablet Take 1 tablet (500 mg total) by mouth 3 (three) times daily as needed. 60 tablet 1  . ondansetron (ZOFRAN) 4 MG tablet Take 1 tablet (4 mg total) by mouth every 8 (eight) hours as needed for nausea or vomiting. 60 tablet 0  . potassium chloride SA (K-DUR,KLOR-CON) 20 MEQ tablet Take 10 mEq by mouth daily.    Marland Kitchen telmisartan-hydrochlorothiazide (MICARDIS HCT) 40-12.5 MG per tablet Take 1.5 tablets by mouth daily.      Allergies:  No Known Allergies  History reviewed. No pertinent family history.  Social History:  reports that she has never smoked. She does not have any smokeless tobacco history on file. She reports that she does not drink alcohol or use illicit drugs.  Review of Systems: negative except as above   Blood pressure 166/77, pulse 70, temperature 98.6 F (37 C), temperature source Oral, resp. rate 18, height 5' 4" (1.626 m), weight 96.3 kg (212 lb 4.9 oz), SpO2 97 %. Head: Normocephalic, without obvious abnormality, atraumatic Neck: no adenopathy, no carotid bruit, no JVD, supple, symmetrical, trachea midline and thyroid not enlarged, symmetric, no tenderness/mass/nodules Resp: clear to auscultation bilaterally Cardio: regular rate and rhythm, S1, S2 normal, no murmur, click, rub or gallop GI: abdomen soft slightly distended with normoactive bowel sounds. [place] mass or guarding Extremities: extremities normal, atraumatic, no cyanosis or edema  Results for orders placed or performed during the hospital encounter of 02/01/14 (from the past 48 hour(s))  Basic metabolic panel     Status: Abnormal   Collection Time: 02/03/14  5:40 AM  Result Value Ref Range   Sodium 142 137 - 147 mEq/L   Potassium 3.6 (L) 3.7 - 5.3 mEq/L   Chloride 103 96 - 112 mEq/L   CO2 26 19 - 32 mEq/L   Glucose, Bld 110 (H) 70 - 99 mg/dL   BUN 20 6 - 23 mg/dL  Creatinine, Ser 0.91 0.50 - 1.10 mg/dL   Calcium 8.3 (L) 8.4 - 10.5 mg/dL   GFR calc non Af Amer 62 (L) >90 mL/min   GFR calc Af Amer 71 (L) >90 mL/min    Comment: (NOTE) The eGFR has been calculated using the CKD EPI equation. This calculation has not been validated in all clinical situations. eGFR's persistently <90 mL/min signify possible Chronic Kidney Disease.    Anion gap 13 5 - 15  CBC     Status: Abnormal   Collection Time: 02/03/14  5:40 AM  Result Value Ref Range   WBC 8.0 4.0 - 10.5 K/uL   RBC 3.87 3.87 - 5.11 MIL/uL   Hemoglobin 10.9 (L) 12.0 - 15.0  g/dL   HCT 34.3 (L) 36.0 - 46.0 %   MCV 88.6 78.0 - 100.0 fL   MCH 28.2 26.0 - 34.0 pg   MCHC 31.8 30.0 - 36.0 g/dL   RDW 14.3 11.5 - 15.5 %   Platelets 274 150 - 400 K/uL  Magnesium     Status: None   Collection Time: 02/03/14  5:40 AM  Result Value Ref Range   Magnesium 2.4 1.5 - 2.5 mg/dL   Dg Abd 2 Views  02/03/2014   CLINICAL DATA:  Vomiting.  EXAM: ABDOMEN - 2 VIEW  COMPARISON:  Same day.  FINDINGS: The bowel gas pattern is normal. Status post cholecystectomy. There is no evidence of free air. No radio-opaque calculi or other significant radiographic abnormality is seen.  IMPRESSION: Bowel distention noted on prior exam has resolved. There is no evidence of bowel obstruction or ileus.   Electronically Signed   By: Sabino Dick M.D.   On: 02/03/2014 10:21   Dg Abd Portable 1v  02/02/2014   CLINICAL DATA:  Abdominal pain.  EXAM: PORTABLE ABDOMEN - 1 VIEW  COMPARISON:  No prior .  FINDINGS: Soft tissue structures are unremarkable. Slightly distended air-filled loops of small large bowel noted. These findings most consistent with adynamic ileus. Surgical clips right upper quadrant consistent with cholecystectomy. Left hip replacement. Pelvic phleboliths. Degenerative changes lumbar spine right hip.  IMPRESSION: Findings most consistent with mild adynamic ileus.   Electronically Signed   By: Marcello Moores  Register   On: 02/02/2014 08:31    Assessment: Upper GI tract symptoms unclear whether nausea and dry heaving, dysphagia reflux or epigastric abdominal pain, rule out acid peptic disease, gastric outlet obstruction etc. Plan:  Plan EGD today after her potassium of 2.6 is been corrected with 4 runs of 10 mEq of potassium.  Mannix Kroeker C 02/04/2014, 7:09 AM

## 2014-02-04 NOTE — Progress Notes (Signed)
nauseous

## 2014-02-05 ENCOUNTER — Inpatient Hospital Stay (HOSPITAL_COMMUNITY): Payer: Medicare Other

## 2014-02-05 ENCOUNTER — Encounter (HOSPITAL_COMMUNITY): Payer: Self-pay | Admitting: Gastroenterology

## 2014-02-05 DIAGNOSIS — N179 Acute kidney failure, unspecified: Secondary | ICD-10-CM

## 2014-02-05 LAB — CBC
HEMATOCRIT: 39.4 % (ref 36.0–46.0)
Hemoglobin: 12.8 g/dL (ref 12.0–15.0)
MCH: 28.3 pg (ref 26.0–34.0)
MCHC: 32.5 g/dL (ref 30.0–36.0)
MCV: 87 fL (ref 78.0–100.0)
Platelets: 368 10*3/uL (ref 150–400)
RBC: 4.53 MIL/uL (ref 3.87–5.11)
RDW: 14.3 % (ref 11.5–15.5)
WBC: 15.3 10*3/uL — ABNORMAL HIGH (ref 4.0–10.5)

## 2014-02-05 LAB — BASIC METABOLIC PANEL
ANION GAP: 17 — AB (ref 5–15)
BUN: 22 mg/dL (ref 6–23)
CALCIUM: 8.4 mg/dL (ref 8.4–10.5)
CHLORIDE: 99 meq/L (ref 96–112)
CO2: 24 mEq/L (ref 19–32)
CREATININE: 2.54 mg/dL — AB (ref 0.50–1.10)
GFR calc non Af Amer: 18 mL/min — ABNORMAL LOW (ref >90)
GFR, EST AFRICAN AMERICAN: 21 mL/min — AB (ref >90)
Glucose, Bld: 116 mg/dL — ABNORMAL HIGH (ref 70–99)
Potassium: 3 mEq/L — ABNORMAL LOW (ref 3.7–5.3)
Sodium: 140 mEq/L (ref 137–147)

## 2014-02-05 LAB — COMPREHENSIVE METABOLIC PANEL
ALBUMIN: 3.1 g/dL — AB (ref 3.5–5.2)
ALK PHOS: 116 U/L (ref 39–117)
ALT: 26 U/L (ref 0–35)
ANION GAP: 18 — AB (ref 5–15)
AST: 26 U/L (ref 0–37)
BUN: 22 mg/dL (ref 6–23)
CO2: 24 mEq/L (ref 19–32)
CREATININE: 2.55 mg/dL — AB (ref 0.50–1.10)
Calcium: 8.6 mg/dL (ref 8.4–10.5)
Chloride: 97 mEq/L (ref 96–112)
GFR calc non Af Amer: 18 mL/min — ABNORMAL LOW (ref >90)
GFR, EST AFRICAN AMERICAN: 21 mL/min — AB (ref >90)
GLUCOSE: 122 mg/dL — AB (ref 70–99)
POTASSIUM: 2.9 meq/L — AB (ref 3.7–5.3)
Sodium: 139 mEq/L (ref 137–147)
TOTAL PROTEIN: 6.6 g/dL (ref 6.0–8.3)
Total Bilirubin: 0.3 mg/dL (ref 0.3–1.2)

## 2014-02-05 LAB — TROPONIN I: Troponin I: <0.3 ng/mL (ref <0.30)

## 2014-02-05 LAB — LIPASE, BLOOD: LIPASE: 22 U/L (ref 11–59)

## 2014-02-05 MED ORDER — POTASSIUM CHLORIDE 10 MEQ/100ML IV SOLN
10.0000 meq | INTRAVENOUS | Status: AC
  Administered 2014-02-05 (×3): 10 meq via INTRAVENOUS
  Filled 2014-02-05 (×3): qty 100

## 2014-02-05 MED ORDER — SODIUM CHLORIDE 0.9 % IV SOLN
INTRAVENOUS | Status: DC
  Administered 2014-02-05 – 2014-02-07 (×4): via INTRAVENOUS

## 2014-02-05 MED ORDER — CARVEDILOL 6.25 MG PO TABS
6.2500 mg | ORAL_TABLET | Freq: Two times a day (BID) | ORAL | Status: DC
  Administered 2014-02-05 – 2014-02-06 (×2): 6.25 mg via ORAL
  Filled 2014-02-05 (×4): qty 1

## 2014-02-05 MED ORDER — IOHEXOL 300 MG/ML  SOLN
25.0000 mL | INTRAMUSCULAR | Status: AC
  Administered 2014-02-05 (×2): 25 mL via ORAL

## 2014-02-05 MED ORDER — ONDANSETRON 8 MG/NS 50 ML IVPB
8.0000 mg | Freq: Four times a day (QID) | INTRAVENOUS | Status: DC | PRN
  Administered 2014-02-05: 8 mg via INTRAVENOUS
  Filled 2014-02-05 (×3): qty 8

## 2014-02-05 MED ORDER — PROCHLORPERAZINE EDISYLATE 5 MG/ML IJ SOLN
10.0000 mg | Freq: Four times a day (QID) | INTRAMUSCULAR | Status: DC | PRN
  Administered 2014-02-05 – 2014-02-06 (×3): 10 mg via INTRAVENOUS
  Filled 2014-02-05 (×5): qty 2

## 2014-02-05 MED ORDER — HYDRALAZINE HCL 20 MG/ML IJ SOLN
10.0000 mg | Freq: Four times a day (QID) | INTRAMUSCULAR | Status: DC | PRN
  Administered 2014-02-05: 10 mg via INTRAVENOUS
  Filled 2014-02-05: qty 1

## 2014-02-05 NOTE — Progress Notes (Signed)
[facility] Progress Note  Subjective: Patient still complaining of significant nausea and midline pain from epigastric to the lower retrosternal area.  Objective: Vital signs in last 24 hours: Temp:  [98 F (36.7 C)-98.2 F (36.8 C)] 98 F (36.7 C) (11/04 0510) Pulse Rate:  [81-112] 81 (11/04 0510) Resp:  [16-30] 16 (11/04 0510) BP: (135-194)/(70-128) 167/87 mmHg (11/04 1100) SpO2:  [95 %-100 %] 97 % (11/04 0510) Weight:  [95.8 kg (211 lb 3.2 oz)] 95.8 kg (211 lb 3.2 oz) (11/04 16100652) Weight change: -0.5 kg (-1 lb 1.6 oz)   RU:[id number]:unchanged  Lab Results: Results for orders placed or performed during the hospital encounter of 02/01/14 (from the past 24 hour(s))  Basic metabolic panel     Status: Abnormal   Collection Time: 02/05/14  5:53 AM  Result Value Ref Range   Sodium 140 137 - 147 mEq/L   Potassium 3.0 (L) 3.7 - 5.3 mEq/L   Chloride 99 96 - 112 mEq/L   CO2 24 19 - 32 mEq/L   Glucose, Bld 116 (H) 70 - 99 mg/dL   BUN 22 6 - 23 mg/dL   Creatinine, Ser 9.142.54 (H) 0.50 - 1.10 mg/dL   Calcium 8.4 8.4 - 78.210.5 mg/dL   GFR calc non Af Amer 18 (L) >90 mL/min   GFR calc Af Amer 21 (L) >90 mL/min   Anion gap 17 (H) 5 - 15  Troponin I     Status: None   Collection Time: 02/05/14  8:59 AM  Result Value Ref Range   Troponin I <0.30 <0.30 ng/mL  Comprehensive metabolic panel     Status: Abnormal   Collection Time: 02/05/14  8:59 AM  Result Value Ref Range   Sodium 139 137 - 147 mEq/L   Potassium 2.9 (LL) 3.7 - 5.3 mEq/L   Chloride 97 96 - 112 mEq/L   CO2 24 19 - 32 mEq/L   Glucose, Bld 122 (H) 70 - 99 mg/dL   BUN 22 6 - 23 mg/dL   Creatinine, Ser 9.562.55 (H) 0.50 - 1.10 mg/dL   Calcium 8.6 8.4 - 21.310.5 mg/dL   Total Protein 6.6 6.0 - 8.3 g/dL   Albumin 3.1 (L) 3.5 - 5.2 g/dL   AST 26 0 - 37 U/L   ALT 26 0 - 35 U/L   Alkaline Phosphatase 116 39 - 117 U/L   Total Bilirubin 0.3 0.3 - 1.2 mg/dL   GFR calc non Af Amer 18 (L) >90 mL/min   GFR calc Af Amer 21 (L) >90 mL/min   Anion gap 18 (H) 5 - 15  Lipase, blood     Status: None   Collection Time: 02/05/14  8:59 AM  Result Value Ref Range   Lipase 22 11 - 59 U/L  CBC     Status: Abnormal   Collection Time: 02/05/14  8:59 AM  Result Value Ref Range   WBC 15.3 (H) 4.0 - 10.5 K/uL   RBC 4.53 3.87 - 5.11 MIL/uL   Hemoglobin 12.8 12.0 - 15.0 g/dL   HCT 08.639.4 57.836.0 - 46.946.0 %   MCV 87.0 78.0 - 100.0 fL   MCH 28.3 26.0 - 34.0 pg   MCHC 32.5 30.0 - 36.0 g/dL   RDW 62.914.3 52.811.5 - 41.315.5 %   Platelets 368 150 - 400 K/uL    Studies/Results: Dg Esophagus  02/04/2014   CLINICAL DATA:  Nausea and vomiting. Previous Nissen fundoplication. Question recurrent hiatal hernia or volvulus on endoscopy.  EXAM: ESOPHOGRAM/BARIUM SWALLOW  TECHNIQUE: Single contrast examination was performed using  thin barium.  FLUOROSCOPY TIME:  1 min 40 seconds  COMPARISON:  None.  FINDINGS: Impression on the distal esophagus is seen consistent with history of previous Nissen fundoplication. A small paraesophageal hernia is seen at this site, which may be due to loosening of the fundoplication wrap.  There is no evidence of esophageal obstruction or mass. There is no evidence of volvulus. Mild dilatation of thoracic esophagus is seen with severe dysmotility including tertiary contractions.  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 No evidence of esophageal mass, obstruction, or volvulus.  Esophageal dysmotility.   Electronically Signed   By: Myles RosenthalJohn  Stahl M.D.   On: 02/04/2014 17:08      Assessment: Significant nausea epigastric and retrosternal pain, etiology unclear with small paraesophageal hernia on EGD and upper GI series, contribution of this to her pain is unclear. She is status post cholecystectomy. She is unable to actually vomit related to her Nissen fundoplication.  Plan: Discussed with primary care and plan to do an abdominal and chest CT scan  today as well as a nuclear medicine gastric emptying study. Surgery may be consulted as well further opinion on contributions of her postoperative anatomy to her symptomatology. Otherwise treat symptoms symptomatically.   Braydee Shimkus C 02/05/2014, 11:36 AM

## 2014-02-05 NOTE — Progress Notes (Signed)
Pt still having some nausea zofran IV,bp earlier 190/95 hydralazine 10mg  IV Iv fld increased to 150 hr Pts's husband very anxious and concerned about pt CT of chest and abd ordered

## 2014-02-05 NOTE — Progress Notes (Signed)
PT Cancellation Note  Patient Details Name: Sheri JensenGwynn D Sizemore MRN: [id number] DOB: [birth year]   Cancelled Treatment:    Reason Eval/Treat Not Completed: Other (comment)   Currently at Radiology;  Will  follow up for PT tomorrow;   Thank you,  Van ClinesHolly Jermel Artley, PT  Acute Rehabilitation Services Pager [phone] Office [phone]     Van ClinesGarrigan, Addie [facility]eder[facility] 02/05/2014, 12:58 PM

## 2014-02-05 NOTE — Plan of Care (Signed)
Problem: Phase II Progression Outcomes Goal: Progress activity as tolerated unless otherwise ordered Outcome: Progressing     

## 2014-02-05 NOTE — Consult Note (Signed)
[facility] Surgery Consult Note  Sheri Mccoy [birth year]  [id number].    Requesting MD: Dr. Sloan Leiter Chief Complaint/Reason for Consult: Abdominal pain and nausea  HPI:  73 yo female, status post recent total right knee arthroplasty (12/20/2013), presents to [facility] with progressively worsening nausea, dry heaving associated with poor oral intake since Friday/Saturday 10/30-31st .   After talking with the patient and her family they note she has been having nausea/dry heaving and GERD symptoms since her knee surgery.  She originally thought it was due to pain medications and antibiotics, but the problem has persisted.  Of note she did say she was severely constipated for 3 weeks after her right TKA, but now is regular.  She recently took [place] for UTI. She states the pain is in her epigastrium and LUQ, intermittent and dull in etiology, 5/10 in severity when present, non radiating and with no specific alleviating factors other than nausea medication.  Nausea is the most distressing symptom.  Denies chest pain, SOB, fever/chills, actual vomiting, difficulty urinating or having BM's, headaches.  She is s/p cholecystectomy, c-section, and Nissen Fundoplication for GERD.    She was admitted to the hospitalist service for further evaluation and workup.  She's been treated with antibiotics for "cellulitis of her right leg", but was found to be more consistent with chronic stasis changes.  They thought she had an ileus, but xray's were improving and are now without ileus pattern.  She is having normal BM's and flatus.  Patient underwent EGD by Dr. Amedeo Plenty which was normal except for a recurrent hiatal hernia.  A CT was obtained which showed hiatal hernia and inflammatory changes to the 3rd to 4th portion of the duodenum with minimalwall thickening of this segment as well as proximal jejunum.  She does have leukocytosis 15.3, and ARF Cr. Level 2.55.     ROS: All systems reviewed and otherwise negative  except for as above  History reviewed. No pertinent family history.  Past Medical History  Diagnosis Date  . Hypertension   . GERD (gastroesophageal reflux disease)   . Arthritis     Past Surgical History  Procedure Laterality Date  . Tonsillectomy    . Joint replacement Left     Left Hip  . Cesarean section    . Total knee arthroplasty Right 12/20/2013    Procedure: RIGHT TOTAL KNEE ARTHROPLASTY;  Surgeon: Augustin Schooling, MD;  Location: [place];  Service: Orthopedics;  Laterality: Right;  . Esophagogastroduodenoscopy Left 02/04/2014    Procedure: ESOPHAGOGASTRODUODENOSCOPY (EGD);  Surgeon: Missy Sabins, MD;  [facility] ENDOSCOPY;  Service: Endoscopy;  Laterality: Left;  possible savary dilatation  . Nissan fundoplication  [address]    [place]    Social History:  reports that she has never smoked. She does not have any smokeless tobacco history on file. She reports that she does not drink alcohol or use illicit drugs.  Allergies: No Known Allergies  Medications Prior to Admission  Medication Sig Dispense Refill  . aspirin 81 MG tablet Take 81 mg by mouth daily.    Marland Kitchen atorvastatin (LIPITOR) 40 MG tablet Take 40 mg by mouth daily.    Marland Kitchen esomeprazole (NEXIUM) 40 MG capsule Take 40 mg by mouth daily at 12 noon.    . meloxicam (MOBIC) 7.5 MG tablet Take 7.5 mg by mouth 2 (two) times daily.    . methocarbamol (ROBAXIN) 500 MG tablet Take 1 tablet (500 mg total) by mouth 3 (three) times daily as needed. [place]  tablet 1  . ondansetron (ZOFRAN) 4 MG tablet Take 1 tablet (4 mg total) by mouth every 8 (eight) hours as needed for nausea or vomiting. 60 tablet 0  . potassium chloride SA (K-DUR,KLOR-CON) 20 MEQ tablet Take 10 mEq by mouth daily.    Marland Kitchen telmisartan-hydrochlorothiazide (MICARDIS HCT) 40-12.5 MG per tablet Take 1.5 tablets by mouth daily.      Blood pressure 180/75, pulse 108, temperature 97 F (36.1 C), temperature source Oral, resp. rate 16, height $RemoveBe'5\' 4"'[id number]$  (1.626 m), weight 211 lb 3.2 oz  (95.8 kg), SpO2 98 %. Physical Exam: General: cooperative, but looks miserable, obviously nauseated, WD/WN white female who is laying in bed in mild distress HEENT: head is normocephalic, atraumatic.  Sclera are noninjected.  PERRL.  Ears and nose without any masses or lesions.  Mouth is pink and moist Heart: regular, rate, and rhythm.  No obvious murmurs, gallops, or rubs noted.  Palpable pedal pulses bilaterally Lungs: CTAB, no wheezes, rhonchi, or rales noted.  Respiratory effort nonlabored Abd: soft, mild tenderness to her upper abdomen (epigastrium and LUQ), +BS, no masses, hernias, or organomegaly, well healed scars noted, no hernias palpated, no rebound or guarding (no peritonitis) MS: all 4 extremities are symmetrical with no cyanosis, clubbing, or edema. Skin: warm and dry with no masses, lesions, or rashes Psych: A&Ox3 with an appropriate affect.   Results for orders placed or performed during the hospital encounter of 02/01/14 (from the past 48 hour(s))  Basic metabolic panel     Status: Abnormal   Collection Time: 02/04/14  6:21 AM  Result Value Ref Range   Sodium 140 137 - 147 mEq/L   Potassium 2.6 (LL) 3.7 - 5.3 mEq/L    Comment: CRITICAL RESULT CALLED TO, READ BACK BY AND VERIFIED WITH: D. PARTELOW,RN AT 0801 02/04/14 BY ZBEECH.    Chloride 97 96 - 112 mEq/L   CO2 28 19 - 32 mEq/L   Glucose, Bld 108 (H) 70 - 99 mg/dL   BUN 15 6 - 23 mg/dL   Creatinine, Ser 1.27 (H) 0.50 - 1.10 mg/dL   Calcium 8.7 8.4 - 10.5 mg/dL   GFR calc non Af Amer 41 (L) >90 mL/min   GFR calc Af Amer 48 (L) >90 mL/min    Comment: (NOTE) The eGFR has been calculated using the CKD EPI equation. This calculation has not been validated in all clinical situations. eGFR's persistently <90 mL/min signify possible Chronic Kidney Disease.    Anion gap 15 5 - 15  Basic metabolic panel     Status: Abnormal   Collection Time: 02/05/14  5:53 AM  Result Value Ref Range   Sodium 140 137 - 147 mEq/L    Potassium 3.0 (L) 3.7 - 5.3 mEq/L   Chloride 99 96 - 112 mEq/L   CO2 24 19 - 32 mEq/L   Glucose, Bld 116 (H) 70 - 99 mg/dL   BUN 22 6 - 23 mg/dL   Creatinine, Ser 2.54 (H) 0.50 - 1.10 mg/dL    Comment: DELTA CHECK NOTED   Calcium 8.4 8.4 - 10.5 mg/dL   GFR calc non Af Amer 18 (L) >90 mL/min   GFR calc Af Amer 21 (L) >90 mL/min    Comment: (NOTE) The eGFR has been calculated using the CKD EPI equation. This calculation has not been validated in all clinical situations. eGFR's persistently <90 mL/min signify possible Chronic Kidney Disease.    Anion gap 17 (H) 5 - 15  Troponin I  Status: None   Collection Time: 02/05/14  8:59 AM  Result Value Ref Range   Troponin I <0.30 <0.30 ng/mL    Comment:        Due to the release kinetics of cTnI, a negative result within the first hours of the onset of symptoms does not rule out myocardial infarction with certainty. If myocardial infarction is still suspected, repeat the test at appropriate intervals.   Comprehensive metabolic panel     Status: Abnormal   Collection Time: 02/05/14  8:59 AM  Result Value Ref Range   Sodium 139 137 - 147 mEq/L   Potassium 2.9 (LL) 3.7 - 5.3 mEq/L    Comment: CRITICAL RESULT CALLED TO, READ BACK BY AND VERIFIED WITH: HOWELL,S RN @ 0950 02/05/14 LEONARD,A    Chloride 97 96 - 112 mEq/L   CO2 24 19 - 32 mEq/L   Glucose, Bld 122 (H) 70 - 99 mg/dL   BUN 22 6 - 23 mg/dL   Creatinine, Ser 8.93 (H) 0.50 - 1.10 mg/dL   Calcium 8.6 8.4 - 73.7 mg/dL   Total Protein 6.6 6.0 - 8.3 g/dL   Albumin 3.1 (L) 3.5 - 5.2 g/dL   AST 26 0 - 37 U/L   ALT 26 0 - 35 U/L   Alkaline Phosphatase 116 39 - 117 U/L   Total Bilirubin 0.3 0.3 - 1.2 mg/dL   GFR calc non Af Amer 18 (L) >90 mL/min   GFR calc Af Amer 21 (L) >90 mL/min    Comment: (NOTE) The eGFR has been calculated using the CKD EPI equation. This calculation has not been validated in all clinical situations. eGFR's persistently <90 mL/min signify possible  Chronic Kidney Disease.    Anion gap 18 (H) 5 - 15  Lipase, blood     Status: None   Collection Time: 02/05/14  8:59 AM  Result Value Ref Range   Lipase 22 11 - 59 U/L  CBC     Status: Abnormal   Collection Time: 02/05/14  8:59 AM  Result Value Ref Range   WBC 15.3 (H) 4.0 - 10.5 K/uL   RBC 4.53 3.87 - 5.11 MIL/uL   Hemoglobin 12.8 12.0 - 15.0 g/dL   HCT 49.6 64.6 - 60.5 %   MCV 87.0 78.0 - 100.0 fL   MCH 28.3 26.0 - 34.0 pg   MCHC 32.5 30.0 - 36.0 g/dL   RDW 63.7 29.4 - 26.2 %   Platelets 368 150 - 400 K/uL   Ct Abdomen Pelvis Wo Contrast  02/05/2014   CLINICAL DATA:  Upper abdominal pain and chest pain for a few days with nausea and vomiting.  EXAM: CT CHEST, ABDOMEN AND PELVIS WITHOUT CONTRAST  TECHNIQUE: Multidetector CT imaging of the chest, abdomen and pelvis was performed following the standard protocol without IV contrast.  COMPARISON:  None.  FINDINGS: CT CHEST FINDINGS  Lungs are adequately inflated and demonstrate minimal linear scarring/ atelectasis over the posterior bases. Heart is normal in size. Subtle possible calcification over the left anterior descending coronary artery. Minimal calcified plaque involving thoracic aorta. No evidence of hilar or axillary adenopathy. There is a moderate size hiatal hernia. There is a small contrast collection along the inferior aspect of the diaphragmatic hiatus at the level of the hiatal hernia likely related patient's prior Nissen fundoplication.  CT ABDOMEN AND PELVIS FINDINGS  Abdominal images demonstrate evidence of a prior cholecystectomy. There is fatty atrophy of the pancreas. The spleen, liver and adrenal glands are  within normal. Kidneys are normal in size, shape and position without evidence of nephrolithiasis. No evidence of obstruction. Ureters are within normal. Note that the mid to distal ureters are difficult to visualize due to moderate streak artifact from patient's left hip prosthesis as well as barium from recent  fluoroscopic evaluation. There is mild diverticulosis of the colon. Appendix is not definitely seen as there is moderate streak artifact over the cecum. There is calcified plaque over the abdominal aorta and iliac arteries. No evidence of bowel obstruction.  Subtle wall thickening involving the 3rd to 4th portion of the duodenum and proximal jejunum with very minimal ill definition of the fat planes adjacent the 3rd to 4th portion of the duodenum as cannot exclude peptic ulcer disease or regional enteritis.  Pelvic images demonstrate a calcified fibroid over the uterine fundus. There is a tiny amount of free fluid over the posterior pelvis. Bladder and rectum are otherwise unremarkable. There are mild degenerative changes of the spine and right hip.  IMPRESSION: Exam is somewhat limited due to streak artifact from retained barium and left hip prosthesis. There is subtle ill definition of the fat planes adjacent the 3rd to 4th portion of the duodenum with minimal wall thickening of this segment as well as proximal jejunum as cannot exclude peptic ulcer disease or regional enteritis.  No acute findings in the chest or pelvis.  Moderate size hiatal hernia with small contained contrast collection at the surgical site in this patient with prior Nissen fundoplication.  Minimal diverticulosis of the colon.  Tiny amount of nonspecific free fluid in the pelvis.   Electronically Signed   By: Marin Olp M.D.   On: 02/05/2014 13:08   Ct Chest Wo Contrast  02/05/2014   CLINICAL DATA:  Upper abdominal pain and chest pain for a few days with nausea and vomiting.  EXAM: CT CHEST, ABDOMEN AND PELVIS WITHOUT CONTRAST  TECHNIQUE: Multidetector CT imaging of the chest, abdomen and pelvis was performed following the standard protocol without IV contrast.  COMPARISON:  None.  FINDINGS: CT CHEST FINDINGS  Lungs are adequately inflated and demonstrate minimal linear scarring/ atelectasis over the posterior bases. Heart is normal in  size. Subtle possible calcification over the left anterior descending coronary artery. Minimal calcified plaque involving thoracic aorta. No evidence of hilar or axillary adenopathy. There is a moderate size hiatal hernia. There is a small contrast collection along the inferior aspect of the diaphragmatic hiatus at the level of the hiatal hernia likely related patient's prior Nissen fundoplication.  CT ABDOMEN AND PELVIS FINDINGS  Abdominal images demonstrate evidence of a prior cholecystectomy. There is fatty atrophy of the pancreas. The spleen, liver and adrenal glands are within normal. Kidneys are normal in size, shape and position without evidence of nephrolithiasis. No evidence of obstruction. Ureters are within normal. Note that the mid to distal ureters are difficult to visualize due to moderate streak artifact from patient's left hip prosthesis as well as barium from recent fluoroscopic evaluation. There is mild diverticulosis of the colon. Appendix is not definitely seen as there is moderate streak artifact over the cecum. There is calcified plaque over the abdominal aorta and iliac arteries. No evidence of bowel obstruction.  Subtle wall thickening involving the 3rd to 4th portion of the duodenum and proximal jejunum with very minimal ill definition of the fat planes adjacent the 3rd to 4th portion of the duodenum as cannot exclude peptic ulcer disease or regional enteritis.  Pelvic images demonstrate a calcified fibroid  over the uterine fundus. There is a tiny amount of free fluid over the posterior pelvis. Bladder and rectum are otherwise unremarkable. There are mild degenerative changes of the spine and right hip.  IMPRESSION: Exam is somewhat limited due to streak artifact from retained barium and left hip prosthesis. There is subtle ill definition of the fat planes adjacent the 3rd to 4th portion of the duodenum with minimal wall thickening of this segment as well as proximal jejunum as cannot  exclude peptic ulcer disease or regional enteritis.  No acute findings in the chest or pelvis.  Moderate size hiatal hernia with small contained contrast collection at the surgical site in this patient with prior Nissen fundoplication.  Minimal diverticulosis of the colon.  Tiny amount of nonspecific free fluid in the pelvis.   Electronically Signed   By: Marin Olp M.D.   On: 02/05/2014 13:08   Dg Esophagus  02/04/2014   CLINICAL DATA:  Nausea and vomiting. Previous Nissen fundoplication. Question recurrent hiatal hernia or volvulus on endoscopy.  EXAM: ESOPHOGRAM/BARIUM SWALLOW  TECHNIQUE: Single contrast examination was performed using  thin barium.  FLUOROSCOPY TIME:  1 min 40 seconds  COMPARISON:  None.  FINDINGS: Impression on the distal esophagus is seen consistent with history of previous Nissen fundoplication. A small paraesophageal hernia is seen at this site, which may be due to loosening of the fundoplication wrap.  There is no evidence of esophageal obstruction or mass. There is no evidence of volvulus. Mild dilatation of thoracic esophagus is seen with severe dysmotility including tertiary contractions.  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 No evidence of esophageal mass, obstruction, or volvulus.  Esophageal dysmotility.   Electronically Signed   By: Earle Gell M.D.   On: 02/04/2014 17:08      Assessment/Plan Recurrent hiatal hernia s/p Nissen Fundoplication in 5701 - Dr. 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 2.55  Plan: 1.  NPO, bowel rest, IVF, pain control, maximize antiemetics 2.  Gastric emptying study would be helpful if this turns out to be gastroparesis.  Increased Zofran dosage, and she's on compazine.  Have put in a call to Dr. Excell Seltzer one of our bariatric  surgeons regarding the patient and Dr. Brantley Stage has also talked with him as well.  She does not have peritonitis, I do not think she requires emergent exploratory surgery.  Her pain is quite mild, but her nausea is pronounced.  Hopefully Dr. Excell Seltzer will have further recommendations given he was her surgeon for her Nissan  3.  SCD's and lovenox for DVT proph 4.  Ambulate as able 5.  May need to stop aspirin given inflammatory changes of the duodenum/jejunum.  I question if this could be in part due to the inflammatory process of the small bowel as well.  She is at obvious risk for PUD.   Coralie Keens, [facility] Surgery 02/05/2014, 4:10 PM Pager: [phone]

## 2014-02-05 NOTE — Progress Notes (Signed)
PATIENT DETAILS Name: Sheri JensenGwynn D Mccoy Age: 73 y.o. Sex: female Date of Birth: Feb 01, 1941 Admit Date: 02/01/2014 Admitting Physician Alison MurrayAlma M Devine, MD VHQ:IONGEX,BMWUXLKPCP:NYLAND,LEONARD Molly MaduroOBERT, MD  Brief Summary: 73 yo female with hx of HTN, Dyslipidemia, recent Right Knee Arthroplasty, recent UTI-admitted with several days hx of nausea-"dry heaving". She was found to have mild ileus of Abd Xray, mild cellulitis of her Right lower extremity. She was admitted for supportive care, IV Abx. Unfortunately, with patient having BM's and inspite of radiologic improvement no improvement in her symptoms. GI was subsequently consulted and underwent EGD (findings below). Now has new ARF and leukocytosis.   Subjective: Still with nausea/dry heaving. Developed worsening upper abd discomfort last evening.   Assessment/Plan: Principal Problem: Nausea/Vomiting:initially felt to be secondary to mild Ileus,however symptoms continuing in spite of radiological improvement and numerous bowel movements.Suspect more of a upper GI cause,? Gastroparesis. Patient has a significant history of reflux/hiatal hernia and underwent Nissen's fundoplication a few years back. Given this history, GI was consulted.Patient underwent endoscopy on 11/3, which showed recurrence of her hiatal hernia.Since continued to have symptoms, have ordered a Gastric Emptying study as well, currently pending. Also spoke with GI MD- Dr Madilyn FiremanHayes this am-who recommended to get a CT Abd/Chest (without contrast) as well. Will repeat LFT's, Lipase levels. She is s/p cholecystectomy. Note-her belly is soft, with mild epigastric tenderness. Unfortunately, etiology of her pain/nausea remains unclear at this time.If above studies unrevealing-?surgery eval.  Active Problems:   Leukocytosis: new this am-?stress margination-no site of infection apparent-her leg is at baseline (has chronic skin changes. UA not really suggestive of UTI, Urine cs positive for 20 K colonies of  multiple bacterial morphotypes. Blood culture 11/1 neg so far. Will check CT Abd/Chest, and panculture if febrile.    Minimal Right Leg Cellulitis:significantly improved, per patient-she had chronic mild erythema, leg back to baseline. Empirically started on both vancomycin and Zosyn on admission, Zosyn discontinued on 11/2, since hardly any erythema  Vancomycin stopped  on 11/3.Leg remains stable.   GMW:NUUVOZDARF:suspect secondary to pre-renal process, increase IVF to 150 cc/hr. Recent UA neg for proteinuria. If renal function continues to worsen, will get renal ultrasound and renal consultation.    Hyponatremia:resolved, secondary to above    Hypokalemia: secondary to above. Will replete and recheck in am.      HTN (hypertension):uncontrolled, continue with Amlodipine, start Coreg, add prn Hydralazine.Micardis/HCTZ on hold.    HLD (hyperlipidemia):will hold statins for now given worsening labs  Disposition: Remain inpatient  Antibiotics:  IV Vancomycin 10/31>>11/4  IV Zosyn 10/31>>11/2  DVT Prophylaxis: Prophylactic Lovenox   Code Status: Full code   Family Communication Spouse at bedside Son-Chris [phone]  Procedures:  None  CONSULTS:  None  MEDICATIONS: Scheduled Meds: . amLODipine  10 mg Oral Daily  . aspirin EC  81 mg Oral Daily  . atorvastatin  40 mg Oral Daily  . enoxaparin (LOVENOX) injection  50 mg Subcutaneous Daily  . iohexol  25 mL Oral Q1 Hr x 2  . ondansetron (ZOFRAN) IV  4 mg Intravenous TID AC & HS  . pantoprazole (PROTONIX) IV  40 mg Intravenous Q12H  . pneumococcal 23 valent vaccine  0.5 mL Intramuscular Tomorrow-1000  . potassium chloride  10 mEq Intravenous Q1 Hr x 3  . potassium chloride  40 mEq Oral Once  . senna-docusate  2 tablet Oral BID   Continuous Infusions: . sodium chloride     PRN Meds:.acetaminophen, hydrALAZINE, morphine  injection, phenol, promethazine  Antibiotics: Anti-infectives    Start     Dose/Rate Route Frequency  Ordered Stop   02/02/14 2100  piperacillin-tazobactam (ZOSYN) IVPB 3.375 g  Status:  Discontinued     3.375 g12.5 mL/hr over 240 Minutes Intravenous 3 times per day 02/02/14 1747 02/03/14 1034   02/02/14 0800  vancomycin (VANCOCIN) IVPB 1000 mg/200 mL premix  Status:  Discontinued     1,000 mg200 mL/hr over 60 Minutes Intravenous Every 12 hours 02/01/14 2336 02/04/14 1229   02/01/14 2359  piperacillin-tazobactam (ZOSYN) IVPB 3.375 g  Status:  Discontinued     3.375 g12.5 mL/hr over 240 Minutes Intravenous 3 times per day 02/01/14 2336 02/02/14 1747   02/01/14 2345  vancomycin (VANCOCIN) 1,500 mg in sodium chloride 0.9 % 500 mL IVPB     1,500 mg250 mL/hr over 120 Minutes Intravenous  Once 02/01/14 2336 02/02/14 0145   02/01/14 2330  piperacillin-tazobactam (ZOSYN) IVPB 3.375 g  Status:  Discontinued     3.375 g100 mL/hr over 30 Minutes Intravenous  Once 02/01/14 2328 02/01/14 2332   02/01/14 2330  vancomycin (VANCOCIN) IVPB 1000 mg/200 mL premix  Status:  Discontinued     1,000 mg200 mL/hr over 60 Minutes Intravenous  Once 02/01/14 2328 02/01/14 2332   02/01/14 1700  clindamycin (CLEOCIN) IVPB 600 mg     600 mg100 mL/hr over 30 Minutes Intravenous  Once 02/01/14 1658 02/01/14 1757       PHYSICAL EXAM: Vital signs in last 24 hours: Filed Vitals:   02/04/14 2048 02/05/14 0510 02/05/14 0652 02/05/14 0744  BP: 185/74 190/95  190/95  Pulse: 87 81    Temp: 98.2 F (36.8 C) 98 F (36.7 C)    TempSrc:      Resp: 18 16    Height:      Weight:   95.8 kg (211 lb 3.2 oz)   SpO2: 96% 97%      Weight change: -0.5 kg (-1 lb 1.6 oz) Filed Weights   02/02/14 0734 02/04/14 0500 02/05/14 0652  Weight: 98.6 kg (217 lb 6 oz) 96.3 kg (212 lb 4.9 oz) 95.8 kg (211 lb 3.2 oz)   Body mass index is 36.23 kg/(m^2).   Gen Exam: Awake and alert with clear speech. Ambulating from the bathroom when I walked in  Neck: Supple, No JVD.   Chest: B/L Clear.   CVS: S1 S2 Regular, no murmurs.  Abdomen: soft,  BS +, mildly tender epigastric area, non distended.  Extremities: no edema, lower extremities warm to touch.Mild chronic erythema to right leg. Neurologic: Non Focal.   Skin: No Rash.   Wounds: N/A.    Intake/Output from previous day:  Intake/Output Summary (Last 24 hours) at 02/05/14 1020 Last data filed at 02/05/14 0900  Gross per 24 hour  Intake      0 ml  Output      0 ml  Net      0 ml     LAB RESULTS: CBC  Recent Labs Lab 02/01/14 1455 02/02/14 0619 02/03/14 0540 02/05/14 0859  WBC 9.9 5.7 8.0 15.3*  HGB 11.6* 10.1* 10.9* 12.8  HCT 35.9* 31.6* 34.3* 39.4  PLT 274 221 274 368  MCV 87.6 88.0 88.6 87.0  MCH 28.3 28.1 28.2 28.3  MCHC 32.3 32.0 31.8 32.5  RDW 14.1 14.3 14.3 14.3  LYMPHSABS 0.4*  --   --   --   MONOABS 0.2  --   --   --   EOSABS  0.0  --   --   --   BASOSABS 0.0  --   --   --     Chemistries   Recent Labs Lab 02/02/14 0619 02/03/14 0540 02/04/14 0621 02/05/14 0553 02/05/14 0859  NA 139 142 140 140 139  K 3.2* 3.6* 2.6* 3.0* 2.9*  CL 101 103 97 99 97  CO2 27 26 28 24 24   GLUCOSE 109* 110* 108* 116* 122*  BUN 23 20 15 22 22   CREATININE 0.70 0.91 1.27* 2.54* 2.55*  CALCIUM 8.1* 8.3* 8.7 8.4 8.6  MG  --  2.4  --   --   --     CBG: No results for input(s): GLUCAP in the last 168 hours.  GFR Estimated Creatinine Clearance: 22.4 mL/min (by C-G formula based on Cr of 2.55).  Coagulation profile No results for input(s): INR, PROTIME in the last 168 hours.  Cardiac Enzymes  Recent Labs Lab 02/05/14 0859  TROPONINI <0.30    Invalid input(s): POCBNP No results for input(s): DDIMER in the last 72 hours. No results for input(s): HGBA1C in the last 72 hours. No results for input(s): CHOL, HDL, LDLCALC, TRIG, CHOLHDL, LDLDIRECT in the last 72 hours. No results for input(s): TSH, T4TOTAL, T3FREE, THYROIDAB in the last 72 hours.  Invalid input(s): FREET3 No results for input(s): VITAMINB12, FOLATE, FERRITIN, TIBC, IRON, RETICCTPCT in  the last 72 hours.  Recent Labs  02/05/14 0859  LIPASE 22    Urine Studies No results for input(s): UHGB, CRYS in the last 72 hours.  Invalid input(s): UACOL, UAPR, USPG, UPH, UTP, UGL, UKET, UBIL, UNIT, UROB, ULEU, UEPI, UWBC, URBC, UBAC, CAST, UCOM, BILUA  MICROBIOLOGY: Recent Results (from the past 240 hour(s))  Urine culture     Status: None   Collection Time: 02/01/14  2:56 PM  Result Value Ref Range Status   Specimen Description URINE, CLEAN CATCH  Final   Special Requests NONE  Final   Culture  Setup Time   Final    02/01/2014 23:54 Performed at [facility]    Colony Count   Final    20,OOO COLONIES/ML Performed at [facility]    Culture   Final    Multiple bacterial morphotypes present, none predominant. Suggest appropriate recollection if clinically indicated. Performed at [facility]    Report Status 02/02/2014 FINAL  Final  Culture, blood (routine x 2)     Status: None (Preliminary result)   Collection Time: 02/02/14 12:43 AM  Result Value Ref Range Status   Specimen Description BLOOD RIGHT HAND  Final   Special Requests BOTTLES DRAWN AEROBIC AND ANAEROBIC 4CC EA  Final   Culture  Setup Time   Final    02/02/2014 18:00 Performed at [facility]    Culture   Final           BLOOD CULTURE RECEIVED NO GROWTH TO DATE CULTURE WILL BE HELD FOR 5 DAYS BEFORE ISSUING A FINAL NEGATIVE REPORT Performed at [facility]    Report Status PENDING  Incomplete  Culture, blood (routine x 2)     Status: None (Preliminary result)   Collection Time: 02/02/14 12:48 AM  Result Value Ref Range Status   Specimen Description BLOOD LEFT HAND  Final   Special Requests BOTTLES DRAWN AEROBIC AND ANAEROBIC 5CC EA  Final   Culture  Setup Time   Final    02/02/2014 18:00 Performed at [facility]    Culture  Final           BLOOD CULTURE RECEIVED NO GROWTH TO DATE CULTURE WILL BE HELD FOR 5 DAYS BEFORE ISSUING A FINAL NEGATIVE  REPORT Performed at [facility]    Report Status PENDING  Incomplete    RADIOLOGY STUDIES/RESULTS: Dg Chest 2 View  02/01/2014   CLINICAL DATA:  Cough.  EXAM: CHEST  2 VIEW  COMPARISON:  12/12/2013  FINDINGS: Borderline cardiomegaly remains stable. Mild bibasilar scarring is also unchanged. No evidence of pulmonary infiltrate or edema. No evidence of pleural effusion.  IMPRESSION: Stable exam.  No active disease.   Electronically Signed   By: Myles Rosenthal M.D.   On: 02/01/2014 15:40   Dg Esophagus  02/04/2014   CLINICAL DATA:  Nausea and vomiting. Previous Nissen fundoplication. Question recurrent hiatal hernia or volvulus on endoscopy.  EXAM: ESOPHOGRAM/BARIUM SWALLOW  TECHNIQUE: Single contrast examination was performed using  thin barium.  FLUOROSCOPY TIME:  1 min 40 seconds  COMPARISON:  None.  FINDINGS: Impression on the distal esophagus is seen consistent with history of previous Nissen fundoplication. A small paraesophageal hernia is seen at this site, which may be due to loosening of the fundoplication wrap.  There is no evidence of esophageal obstruction or mass. There is no evidence of volvulus. Mild dilatation of thoracic esophagus is seen with severe dysmotility including tertiary contractions.  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 No evidence of esophageal mass, obstruction, or volvulus.  Esophageal dysmotility.   Electronically Signed   By: Myles Rosenthal M.D.   On: 02/04/2014 17:08   Dg Abd 2 Views  02/03/2014   CLINICAL DATA:  Vomiting.  EXAM: ABDOMEN - 2 VIEW  COMPARISON:  Same day.  FINDINGS: The bowel gas pattern is normal. Status post cholecystectomy. There is no evidence of free air. No radio-opaque calculi or other significant radiographic abnormality is seen.  IMPRESSION: Bowel distention noted on prior exam has resolved. There is no evidence of bowel obstruction or  ileus.   Electronically Signed   By: Roque Lias M.D.   On: 02/03/2014 10:21   Dg Abd Portable 1v  02/02/2014   CLINICAL DATA:  Abdominal pain.  EXAM: PORTABLE ABDOMEN - 1 VIEW  COMPARISON:  No prior .  FINDINGS: Soft tissue structures are unremarkable. Slightly distended air-filled loops of small large bowel noted. These findings most consistent with adynamic ileus. Surgical clips right upper quadrant consistent with cholecystectomy. Left hip replacement. Pelvic phleboliths. Degenerative changes lumbar spine right hip.  IMPRESSION: Findings most consistent with mild adynamic ileus.   Electronically Signed   By: Maisie Fus  Register   On: 02/02/2014 08:31    Jeoffrey Massed, MD  [facility] Pager:[phone]  If 7PM-7AM, please contact night-coverage [email or web address] Password [email or web address] 02/05/2014, 10:20 AM   LOS: 4 days

## 2014-02-06 ENCOUNTER — Inpatient Hospital Stay (HOSPITAL_COMMUNITY): Payer: Medicare Other

## 2014-02-06 LAB — BASIC METABOLIC PANEL
ANION GAP: 17 — AB (ref 5–15)
BUN: 25 mg/dL — ABNORMAL HIGH (ref 6–23)
CO2: 23 meq/L (ref 19–32)
CREATININE: 2.83 mg/dL — AB (ref 0.50–1.10)
Calcium: 7.8 mg/dL — ABNORMAL LOW (ref 8.4–10.5)
Chloride: 99 mEq/L (ref 96–112)
GFR calc Af Amer: 18 mL/min — ABNORMAL LOW (ref >90)
GFR, EST NON AFRICAN AMERICAN: 16 mL/min — AB (ref >90)
Glucose, Bld: 91 mg/dL (ref 70–99)
Potassium: 3 mEq/L — ABNORMAL LOW (ref 3.7–5.3)
SODIUM: 139 meq/L (ref 137–147)

## 2014-02-06 LAB — URINALYSIS, ROUTINE W REFLEX MICROSCOPIC
Bilirubin Urine: NEGATIVE
Glucose, UA: NEGATIVE mg/dL
HGB URINE DIPSTICK: NEGATIVE
Ketones, ur: NEGATIVE mg/dL
Leukocytes, UA: NEGATIVE
NITRITE: NEGATIVE
PROTEIN: NEGATIVE mg/dL
Specific Gravity, Urine: 1.008 (ref 1.005–1.030)
Urobilinogen, UA: 0.2 mg/dL (ref 0.0–1.0)
pH: 6 (ref 5.0–8.0)

## 2014-02-06 LAB — CBC
HCT: 34.1 % — ABNORMAL LOW (ref 36.0–46.0)
Hemoglobin: 11 g/dL — ABNORMAL LOW (ref 12.0–15.0)
MCH: 28.1 pg (ref 26.0–34.0)
MCHC: 32.3 g/dL (ref 30.0–36.0)
MCV: 87 fL (ref 78.0–100.0)
PLATELETS: 315 10*3/uL (ref 150–400)
RBC: 3.92 MIL/uL (ref 3.87–5.11)
RDW: 14.4 % (ref 11.5–15.5)
WBC: 13.3 10*3/uL — ABNORMAL HIGH (ref 4.0–10.5)

## 2014-02-06 MED ORDER — ENOXAPARIN SODIUM 30 MG/0.3ML ~~LOC~~ SOLN
30.0000 mg | Freq: Every day | SUBCUTANEOUS | Status: DC
  Administered 2014-02-06 – 2014-02-10 (×5): 30 mg via SUBCUTANEOUS
  Filled 2014-02-06 (×6): qty 0.3

## 2014-02-06 MED ORDER — METOCLOPRAMIDE HCL 5 MG/ML IJ SOLN
5.0000 mg | Freq: Three times a day (TID) | INTRAMUSCULAR | Status: DC
  Administered 2014-02-06 – 2014-02-09 (×14): 5 mg via INTRAVENOUS
  Filled 2014-02-06 (×8): qty 1
  Filled 2014-02-06 (×2): qty 2
  Filled 2014-02-06 (×6): qty 1
  Filled 2014-02-06: qty 2
  Filled 2014-02-06 (×2): qty 1

## 2014-02-06 MED ORDER — TECHNETIUM TC 99M MEBROFENIN IV KIT
5.0000 | PACK | Freq: Once | INTRAVENOUS | Status: AC | PRN
  Administered 2014-02-06: 5 via INTRAVENOUS

## 2014-02-06 MED ORDER — POTASSIUM CHLORIDE 10 MEQ/100ML IV SOLN
10.0000 meq | INTRAVENOUS | Status: AC
  Administered 2014-02-06 (×3): 10 meq via INTRAVENOUS
  Filled 2014-02-06 (×3): qty 100

## 2014-02-06 MED ORDER — CARVEDILOL 12.5 MG PO TABS
12.5000 mg | ORAL_TABLET | Freq: Two times a day (BID) | ORAL | Status: DC
  Administered 2014-02-06 – 2014-02-08 (×4): 12.5 mg via ORAL
  Filled 2014-02-06 (×6): qty 1

## 2014-02-06 NOTE — Progress Notes (Signed)
[facility] Progress Note  Subjective: Still complaining of pain near the xiphoid process as well as nausea and dry heaves. States that she vomited up some juice earlierObjective: Vital signs in last 24 hours: Temp:  [97 F (36.1 C)-98 F (36.7 C)] 97.6 F (36.4 C) (11/05 0536) Pulse Rate:  [71-108] 81 (11/05 0536) Resp:  [16] 16 (11/04 1300) BP: (164-180)/(74-87) 177/82 mmHg (11/05 0536) SpO2:  [96 %-98 %] 96 % (11/05 0536) Weight:  [96.1 kg (211 lb 13.8 oz)-97.2 kg (214 lb 4.6 oz)] 97.2 kg (214 lb 4.6 oz) (11/05 0539) Weight change: 0.3 kg (10.6 oz)   ZO:[id number] soft and moderately tender soft  Lab Results: Results for orders placed or performed during the hospital encounter of 02/01/14 (from the past 24 hour(s))  Troponin I     Status: None   Collection Time: 02/05/14  8:59 AM  Result Value Ref Range   Troponin I <0.30 <0.30 ng/mL  Comprehensive metabolic panel     Status: Abnormal   Collection Time: 02/05/14  8:59 AM  Result Value Ref Range   Sodium 139 137 - 147 mEq/L   Potassium 2.9 (LL) 3.7 - 5.3 mEq/L   Chloride 97 96 - 112 mEq/L   CO2 24 19 - 32 mEq/L   Glucose, Bld 122 (H) 70 - 99 mg/dL   BUN 22 6 - 23 mg/dL   Creatinine, Ser 4.09 (H) 0.50 - 1.10 mg/dL   Calcium 8.6 8.4 - 81.1 mg/dL   Total Protein 6.6 6.0 - 8.3 g/dL   Albumin 3.1 (L) 3.5 - 5.2 g/dL   AST 26 0 - 37 U/L   ALT 26 0 - 35 U/L   Alkaline Phosphatase 116 39 - 117 U/L   Total Bilirubin 0.3 0.3 - 1.2 mg/dL   GFR calc non Af Amer 18 (L) >90 mL/min   GFR calc Af Amer 21 (L) >90 mL/min   Anion gap 18 (H) 5 - 15  Lipase, blood     Status: None   Collection Time: 02/05/14  8:59 AM  Result Value Ref Range   Lipase 22 11 - 59 U/L  CBC     Status: Abnormal   Collection Time: 02/05/14  8:59 AM  Result Value Ref Range   WBC 15.3 (H) 4.0 - 10.5 K/uL   RBC 4.53 3.87 - 5.11 MIL/uL   Hemoglobin 12.8 12.0 - 15.0 g/dL   HCT 91.4 78.2 - 95.6 %   MCV 87.0 78.0 - 100.0 fL   MCH 28.3 26.0 - 34.0 pg    MCHC 32.5 30.0 - 36.0 g/dL   RDW 21.3 08.6 - 57.8 %   Platelets 368 150 - 400 K/uL  CBC     Status: Abnormal   Collection Time: 02/06/14  4:11 AM  Result Value Ref Range   WBC 13.3 (H) 4.0 - 10.5 K/uL   RBC 3.92 3.87 - 5.11 MIL/uL   Hemoglobin 11.0 (L) 12.0 - 15.0 g/dL   HCT 46.9 (L) 62.9 - 52.8 %   MCV 87.0 78.0 - 100.0 fL   MCH 28.1 26.0 - 34.0 pg   MCHC 32.3 30.0 - 36.0 g/dL   RDW 41.3 24.4 - 01.0 %   Platelets 315 150 - 400 K/uL  Basic metabolic panel     Status: Abnormal   Collection Time: 02/06/14  4:11 AM  Result Value Ref Range   Sodium 139 137 - 147 mEq/L   Potassium 3.0 (L) 3.7 - 5.3 mEq/L   Chloride  99 96 - 112 mEq/L   CO2 23 19 - 32 mEq/L   Glucose, Bld 91 70 - 99 mg/dL   BUN 25 (H) 6 - 23 mg/dL   Creatinine, Ser 1.19 (H) 0.50 - 1.10 mg/dL   Calcium 7.8 (L) 8.4 - 10.5 mg/dL   GFR calc non Af Amer 16 (L) >90 mL/min   GFR calc Af Amer 18 (L) >90 mL/min   Anion gap 17 (H) 5 - 15    Studies/Results: Ct Abdomen Pelvis Wo Contrast  02/05/2014   CLINICAL DATA:  Upper abdominal pain and chest pain for a few days with nausea and vomiting.  EXAM: CT CHEST, ABDOMEN AND PELVIS WITHOUT CONTRAST  TECHNIQUE: Multidetector CT imaging of the chest, abdomen and pelvis was performed following the standard protocol without IV contrast.  COMPARISON:  None.  FINDINGS: CT CHEST FINDINGS  Lungs are adequately inflated and demonstrate minimal linear scarring/ atelectasis over the posterior bases. Heart is normal in size. Subtle possible calcification over the left anterior descending coronary artery. Minimal calcified plaque involving thoracic aorta. No evidence of hilar or axillary adenopathy. There is a moderate size hiatal hernia. There is a small contrast collection along the inferior aspect of the diaphragmatic hiatus at the level of the hiatal hernia likely related patient's prior Nissen fundoplication.  CT ABDOMEN AND PELVIS FINDINGS  Abdominal images demonstrate evidence of a prior  cholecystectomy. There is fatty atrophy of the pancreas. The spleen, liver and adrenal glands are within normal. Kidneys are normal in size, shape and position without evidence of nephrolithiasis. No evidence of obstruction. Ureters are within normal. Note that the mid to distal ureters are difficult to visualize due to moderate streak artifact from patient's left hip prosthesis as well as barium from recent fluoroscopic evaluation. There is mild diverticulosis of the colon. Appendix is not definitely seen as there is moderate streak artifact over the cecum. There is calcified plaque over the abdominal aorta and iliac arteries. No evidence of bowel obstruction.  Subtle wall thickening involving the 3rd to 4th portion of the duodenum and proximal jejunum with very minimal ill definition of the fat planes adjacent the 3rd to 4th portion of the duodenum as cannot exclude peptic ulcer disease or regional enteritis.  Pelvic images demonstrate a calcified fibroid over the uterine fundus. There is a tiny amount of free fluid over the posterior pelvis. Bladder and rectum are otherwise unremarkable. There are mild degenerative changes of the spine and right hip.  IMPRESSION: Exam is somewhat limited due to streak artifact from retained barium and left hip prosthesis. There is subtle ill definition of the fat planes adjacent the 3rd to 4th portion of the duodenum with minimal wall thickening of this segment as well as proximal jejunum as cannot exclude peptic ulcer disease or regional enteritis.  No acute findings in the chest or pelvis.  Moderate size hiatal hernia with small contained contrast collection at the surgical site in this patient with prior Nissen fundoplication.  Minimal diverticulosis of the colon.  Tiny amount of nonspecific free fluid in the pelvis.   Electronically Signed   By: Elberta Fortis M.D.   On: 02/05/2014 13:08   Ct Chest Wo Contrast  02/05/2014   CLINICAL DATA:  Upper abdominal pain and chest  pain for a few days with nausea and vomiting.  EXAM: CT CHEST, ABDOMEN AND PELVIS WITHOUT CONTRAST  TECHNIQUE: Multidetector CT imaging of the chest, abdomen and pelvis was performed following the standard protocol without IV  contrast.  COMPARISON:  None.  FINDINGS: CT CHEST FINDINGS  Lungs are adequately inflated and demonstrate minimal linear scarring/ atelectasis over the posterior bases. Heart is normal in size. Subtle possible calcification over the left anterior descending coronary artery. Minimal calcified plaque involving thoracic aorta. No evidence of hilar or axillary adenopathy. There is a moderate size hiatal hernia. There is a small contrast collection along the inferior aspect of the diaphragmatic hiatus at the level of the hiatal hernia likely related patient's prior Nissen fundoplication.  CT ABDOMEN AND PELVIS FINDINGS  Abdominal images demonstrate evidence of a prior cholecystectomy. There is fatty atrophy of the pancreas. The spleen, liver and adrenal glands are within normal. Kidneys are normal in size, shape and position without evidence of nephrolithiasis. No evidence of obstruction. Ureters are within normal. Note that the mid to distal ureters are difficult to visualize due to moderate streak artifact from patient's left hip prosthesis as well as barium from recent fluoroscopic evaluation. There is mild diverticulosis of the colon. Appendix is not definitely seen as there is moderate streak artifact over the cecum. There is calcified plaque over the abdominal aorta and iliac arteries. No evidence of bowel obstruction.  Subtle wall thickening involving the 3rd to 4th portion of the duodenum and proximal jejunum with very minimal ill definition of the fat planes adjacent the 3rd to 4th portion of the duodenum as cannot exclude peptic ulcer disease or regional enteritis.  Pelvic images demonstrate a calcified fibroid over the uterine fundus. There is a tiny amount of free fluid over the  posterior pelvis. Bladder and rectum are otherwise unremarkable. There are mild degenerative changes of the spine and right hip.  IMPRESSION: Exam is somewhat limited due to streak artifact from retained barium and left hip prosthesis. There is subtle ill definition of the fat planes adjacent the 3rd to 4th portion of the duodenum with minimal wall thickening of this segment as well as proximal jejunum as cannot exclude peptic ulcer disease or regional enteritis.  No acute findings in the chest or pelvis.  Moderate size hiatal hernia with small contained contrast collection at the surgical site in this patient with prior Nissen fundoplication.  Minimal diverticulosis of the colon.  Tiny amount of nonspecific free fluid in the pelvis.   Electronically Signed   By: Elberta Fortisaniel  Boyle M.D.   On: 02/05/2014 13:08   Dg Esophagus  02/04/2014   CLINICAL DATA:  Nausea and vomiting. Previous Nissen fundoplication. Question recurrent hiatal hernia or volvulus on endoscopy.  EXAM: ESOPHOGRAM/BARIUM SWALLOW  TECHNIQUE: Single contrast examination was performed using  thin barium.  FLUOROSCOPY TIME:  1 min 40 seconds  COMPARISON:  None.  FINDINGS: Impression on the distal esophagus is seen consistent with history of previous Nissen fundoplication. A small paraesophageal hernia is seen at this site, which may be due to loosening of the fundoplication wrap.  There is no evidence of esophageal obstruction or mass. There is no evidence of volvulus. Mild dilatation of thoracic esophagus is seen with severe dysmotility including tertiary contractions.  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 No evidence of esophageal mass, obstruction, or volvulus.  Esophageal dysmotility.   Electronically Signed   By: Myles RosenthalJohn  Stahl M.D.   On: 02/04/2014 17:08      Assessment: Symptom complex difficult to sort out with abdominal pain chest pain  nausea and dry heaves, workup equivocal 2 date. CT scan suggested some  possible subtle thickening of this area looked normal on endoscopy. There is also a small recurrence of a hiatal hernia with small paraesophageal component Recent rise in creatinine Plan: 1. Continue PPI and symptomatically medicines 2. Nuclear medicine gastric emptying study today to rule out a component of gastroparesis 3. Surgery following    Jasmyn Picha C 02/06/2014, 8:36 AM

## 2014-02-06 NOTE — Progress Notes (Signed)
2 Days Post-Op Subjective: HD #5. Reports nausea and dry heaving has improved over night with increased dose of Zofran. Small, loose BM this morning. NPO in preparation for gastric emptying study at 11:00am today. No pain.   Objective: Vital signs in last 24 hours: Temp:  [97 F (36.1 C)-98 F (36.7 C)] 97.6 F (36.4 C) (11/05 0536) Pulse Rate:  [71-108] 81 (11/05 0536) Resp:  [16] 16 (11/04 1300) BP: (164-180)/(74-87) 177/82 mmHg (11/05 0536) SpO2:  [96 %-98 %] 96 % (11/05 0536) Weight:  [96.1 kg (211 lb 13.8 oz)-97.2 kg (214 lb 4.6 oz)] 97.2 kg (214 lb 4.6 oz) (11/05 0539) Last BM Date: 02/02/14  Intake/Output from previous day: 11/04 0701 - 11/05 0700 In: 2385 [I.V.:2335; IV Piggyback:50] Out: -  Intake/Output this shift:    PE: ENT: oral mucous membrane are quite dry Card: RRR with frequent ectopic beats Pulm: CTA with diminished BS at posterior bases.  Abd: soft, obese, mild tenderness in epigastrium, hypoactive BS, non-distended.    Lab Results:   Recent Labs  02/05/14 0859 02/06/14 0411  WBC 15.3* 13.3*  HGB 12.8 11.0*  HCT 39.4 34.1*  PLT 368 315   BMET  Recent Labs  02/05/14 0859 02/06/14 0411  NA 139 139  K 2.9* 3.0*  CL 97 99  CO2 24 23  GLUCOSE 122* 91  BUN 22 25*  CREATININE 2.55* 2.83*  CALCIUM 8.6 7.8*   PT/INR No results for input(s): LABPROT, INR in the last 72 hours. CMP     Component Value Date/Time   NA 139 02/06/2014 0411   K 3.0* 02/06/2014 0411   CL 99 02/06/2014 0411   CO2 23 02/06/2014 0411   GLUCOSE 91 02/06/2014 0411   BUN 25* 02/06/2014 0411   CREATININE 2.83* 02/06/2014 0411   CALCIUM 7.8* 02/06/2014 0411   PROT 6.6 02/05/2014 0859   ALBUMIN 3.1* 02/05/2014 0859   AST 26 02/05/2014 0859   ALT 26 02/05/2014 0859   ALKPHOS 116 02/05/2014 0859   BILITOT 0.3 02/05/2014 0859   GFRNONAA 16* 02/06/2014 0411   GFRAA 18* 02/06/2014 0411   Lipase     Component Value Date/Time   LIPASE 22 02/05/2014 0859        Studies/Results: Ct Abdomen Pelvis Wo Contrast  02/05/2014   CLINICAL DATA:  Upper abdominal pain and chest pain for a few days with nausea and vomiting.  EXAM: CT CHEST, ABDOMEN AND PELVIS WITHOUT CONTRAST  TECHNIQUE: Multidetector CT imaging of the chest, abdomen and pelvis was performed following the standard protocol without IV contrast.  COMPARISON:  None.  FINDINGS: CT CHEST FINDINGS  Lungs are adequately inflated and demonstrate minimal linear scarring/ atelectasis over the posterior bases. Heart is normal in size. Subtle possible calcification over the left anterior descending coronary artery. Minimal calcified plaque involving thoracic aorta. No evidence of hilar or axillary adenopathy. There is a moderate size hiatal hernia. There is a small contrast collection along the inferior aspect of the diaphragmatic hiatus at the level of the hiatal hernia likely related patient's prior Nissen fundoplication.  CT ABDOMEN AND PELVIS FINDINGS  Abdominal images demonstrate evidence of a prior cholecystectomy. There is fatty atrophy of the pancreas. The spleen, liver and adrenal glands are within normal. Kidneys are normal in size, shape and position without evidence of nephrolithiasis. No evidence of obstruction. Ureters are within normal. Note that the mid to distal ureters are difficult to visualize due to moderate streak artifact from patient's left hip prosthesis as  well as barium from recent fluoroscopic evaluation. There is mild diverticulosis of the colon. Appendix is not definitely seen as there is moderate streak artifact over the cecum. There is calcified plaque over the abdominal aorta and iliac arteries. No evidence of bowel obstruction.  Subtle wall thickening involving the 3rd to 4th portion of the duodenum and proximal jejunum with very minimal ill definition of the fat planes adjacent the 3rd to 4th portion of the duodenum as cannot exclude peptic ulcer disease or regional enteritis.   Pelvic images demonstrate a calcified fibroid over the uterine fundus. There is a tiny amount of free fluid over the posterior pelvis. Bladder and rectum are otherwise unremarkable. There are mild degenerative changes of the spine and right hip.  IMPRESSION: Exam is somewhat limited due to streak artifact from retained barium and left hip prosthesis. There is subtle ill definition of the fat planes adjacent the 3rd to 4th portion of the duodenum with minimal wall thickening of this segment as well as proximal jejunum as cannot exclude peptic ulcer disease or regional enteritis.  No acute findings in the chest or pelvis.  Moderate size hiatal hernia with small contained contrast collection at the surgical site in this patient with prior Nissen fundoplication.  Minimal diverticulosis of the colon.  Tiny amount of nonspecific free fluid in the pelvis.   Electronically Signed   By: Elberta Fortis M.D.   On: 02/05/2014 13:08   Ct Chest Wo Contrast  02/05/2014   CLINICAL DATA:  Upper abdominal pain and chest pain for a few days with nausea and vomiting.  EXAM: CT CHEST, ABDOMEN AND PELVIS WITHOUT CONTRAST  TECHNIQUE: Multidetector CT imaging of the chest, abdomen and pelvis was performed following the standard protocol without IV contrast.  COMPARISON:  None.  FINDINGS: CT CHEST FINDINGS  Lungs are adequately inflated and demonstrate minimal linear scarring/ atelectasis over the posterior bases. Heart is normal in size. Subtle possible calcification over the left anterior descending coronary artery. Minimal calcified plaque involving thoracic aorta. No evidence of hilar or axillary adenopathy. There is a moderate size hiatal hernia. There is a small contrast collection along the inferior aspect of the diaphragmatic hiatus at the level of the hiatal hernia likely related patient's prior Nissen fundoplication.  CT ABDOMEN AND PELVIS FINDINGS  Abdominal images demonstrate evidence of a prior cholecystectomy. There is fatty  atrophy of the pancreas. The spleen, liver and adrenal glands are within normal. Kidneys are normal in size, shape and position without evidence of nephrolithiasis. No evidence of obstruction. Ureters are within normal. Note that the mid to distal ureters are difficult to visualize due to moderate streak artifact from patient's left hip prosthesis as well as barium from recent fluoroscopic evaluation. There is mild diverticulosis of the colon. Appendix is not definitely seen as there is moderate streak artifact over the cecum. There is calcified plaque over the abdominal aorta and iliac arteries. No evidence of bowel obstruction.  Subtle wall thickening involving the 3rd to 4th portion of the duodenum and proximal jejunum with very minimal ill definition of the fat planes adjacent the 3rd to 4th portion of the duodenum as cannot exclude peptic ulcer disease or regional enteritis.  Pelvic images demonstrate a calcified fibroid over the uterine fundus. There is a tiny amount of free fluid over the posterior pelvis. Bladder and rectum are otherwise unremarkable. There are mild degenerative changes of the spine and right hip.  IMPRESSION: Exam is somewhat limited due to streak artifact from  retained barium and left hip prosthesis. There is subtle ill definition of the fat planes adjacent the 3rd to 4th portion of the duodenum with minimal wall thickening of this segment as well as proximal jejunum as cannot exclude peptic ulcer disease or regional enteritis.  No acute findings in the chest or pelvis.  Moderate size hiatal hernia with small contained contrast collection at the surgical site in this patient with prior Nissen fundoplication.  Minimal diverticulosis of the colon.  Tiny amount of nonspecific free fluid in the pelvis.   Electronically Signed   By: Elberta Fortisaniel  Boyle M.D.   On: 02/05/2014 13:08   Dg Esophagus  02/04/2014   CLINICAL DATA:  Nausea and vomiting. Previous Nissen fundoplication. Question recurrent  hiatal hernia or volvulus on endoscopy.  EXAM: ESOPHOGRAM/BARIUM SWALLOW  TECHNIQUE: Single contrast examination was performed using  thin barium.  FLUOROSCOPY TIME:  1 min 40 seconds  COMPARISON:  None.  FINDINGS: Impression on the distal esophagus is seen consistent with history of previous Nissen fundoplication. A small paraesophageal hernia is seen at this site, which may be due to loosening of the fundoplication wrap.  There is no evidence of esophageal obstruction or mass. There is no evidence of volvulus. Mild dilatation of thoracic esophagus is seen with severe dysmotility including tertiary contractions.  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 No evidence of esophageal mass, obstruction, or volvulus.  Esophageal dysmotility.   Electronically Signed   By: Myles RosenthalJohn  Stahl M.D.   On: 02/04/2014 17:08    Anti-infectives: Anti-infectives    Start     Dose/Rate Route Frequency Ordered Stop   02/02/14 2100  piperacillin-tazobactam (ZOSYN) IVPB 3.375 g  Status:  Discontinued     3.375 g12.5 mL/hr over 240 Minutes Intravenous 3 times per day 02/02/14 1747 02/03/14 1034   02/02/14 0800  vancomycin (VANCOCIN) IVPB 1000 mg/200 mL premix  Status:  Discontinued     1,000 mg200 mL/hr over 60 Minutes Intravenous Every 12 hours 02/01/14 2336 02/04/14 1229   02/01/14 2359  piperacillin-tazobactam (ZOSYN) IVPB 3.375 g  Status:  Discontinued     3.375 g12.5 mL/hr over 240 Minutes Intravenous 3 times per day 02/01/14 2336 02/02/14 1747   02/01/14 2345  vancomycin (VANCOCIN) 1,500 mg in sodium chloride 0.9 % 500 mL IVPB     1,500 mg250 mL/hr over 120 Minutes Intravenous  Once 02/01/14 2336 02/02/14 0145   02/01/14 2330  piperacillin-tazobactam (ZOSYN) IVPB 3.375 g  Status:  Discontinued     3.375 g100 mL/hr over 30 Minutes Intravenous  Once 02/01/14 2328 02/01/14 2332   02/01/14 2330  vancomycin (VANCOCIN) IVPB 1000  mg/200 mL premix  Status:  Discontinued     1,000 mg200 mL/hr over 60 Minutes Intravenous  Once 02/01/14 2328 02/01/14 2332   02/01/14 1700  clindamycin (CLEOCIN) IVPB 600 mg     600 mg100 mL/hr over 30 Minutes Intravenous  Once 02/01/14 1658 02/01/14 1757       Assessment/Plan Recurrent hiatal hernia s/p Nissen Fundoplication in 2012 - Dr. Johna SheriffHoxworth for GERD Inflammatory changes to duodenum/jejunum Epigastric/LUQ abdominal pain Severe nausea Leukocytosis - 13.3 ARF - Cr. 2.83 Persistent hypokalemia  Plan: 1. NPO, bowel rest, IVF, pain control, maximize antiemetics. To have gastric emptying study today at 11:00am. Nausea improving on increased doses of antiemetics. 2. AFR: Creatinine now 2.83. Receiving 111050ml/hr NS. Renal U/S ordered by medicine service for today. Will need to monitor  UO closely.  3. SCD's and lovenox for DVT proph 4. Ambulate as able. 5. May need to stop aspirin given inflammatory changes of the duodenum/jejunum. I question if this could be in part due to the inflammatory process of the small bowel as well. She is at obvious risk for PUD.  6. Hypokalemia: has 30 meq of IV potassium ordered for today.    LOS: 5 days    Ardis RowanPRESSON, Keir Foland LEE, [facility] 02/06/2014, 8:18 AM Pager: [phone]

## 2014-02-06 NOTE — Progress Notes (Signed)
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 Pt denies any known triggering factors but explains she was recently started on Macrobid for UTI and it seems to her, nausea started right after she was started on the medication. She also reports upper abd quadrants discomfort; low grade fever 99.5 F, physical exam fairly unremarkable but right foot erythema noted and findings consistent with cellulitis.     PT Comments    Patient progress with physical therapy limited today secondary to general malaise and fatigue. She was willing to get out of bed and participate in transfer training, therapeutic exercises, and ambulate a very short distance to the chair. Will continue to work with patient acutely. She will continue to benefit from skilled physical therapy services to further improve independence with functional mobility.   Follow Up Recommendations  Outpatient PT;Supervision/Assistance - 24 hour     Equipment Recommendations  None recommended by PT    Recommendations for Other Services       Precautions / Restrictions Precautions Precautions: None Restrictions Weight Bearing Restrictions: Yes RLE Weight Bearing: Weight bearing as tolerated    Mobility  Bed Mobility Overal bed mobility: Needs Assistance Bed Mobility: Supine to Sit     Supine to sit: Min assist     General bed mobility comments: Min assist for boost into seated position from flat bed surface.  Transfers Overall transfer level: Needs assistance Equipment used: None Transfers: Sit to/from Stand Sit to Stand: Supervision         General transfer comment: Supervision for safety. Performed from  lowest bed setting. No physical assist required.  Ambulation/Gait Ambulation/Gait assistance: Min guard Ambulation Distance (Feet): 5 Feet Assistive device:  (Held onto IV pole) Gait Pattern/deviations: Step-through pattern;Decreased stride length Gait velocity: slowed   General Gait Details: Pt only able to tolerate short distance today due to malaise. VC for positioning prior to attempting to sit into chair.   Stairs            Wheelchair Mobility    Modified Rankin (Stroke Patients Only)       Balance                                    Cognition Arousal/Alertness: Awake/alert Behavior During Therapy: WFL for tasks assessed/performed Overall Cognitive Status: Within Functional Limits for tasks assessed                      Exercises General Exercises - Lower Extremity Ankle Circles/Pumps: AROM;Both;15 reps;Seated Quad Sets: Strengthening;Both;10 reps;Seated Long Arc Quad: Strengthening;Right;10 reps;Seated    General Comments General comments (skin integrity, edema, etc.): Pt states she feels a little better than yesterday but still nauseated.  Willing to get OOB for a while but unwilling to ambulate further distance due to nausea.      Pertinent Vitals/Pain Pain Assessment: 0-10 Pain Score:  ("just don't feel well" no value given) Pain Location: abdomen Pain Intervention(s): Monitored during session;Repositioned    Home Living                      Prior Function  PT Goals (current goals can now be found in the care plan section) Acute Rehab PT Goals PT Goal Formulation: With patient Time For Goal Achievement: 02/17/14 Potential to Achieve Goals: Good Progress towards PT goals: Progressing toward goals    Frequency  Min 3X/week    PT Plan Current plan remains appropriate    Co-evaluation             End of Session Equipment Utilized During Treatment: Gait belt Activity Tolerance: Patient limited  by fatigue;Other (comment) (limited by malaise) Patient left: with call bell/phone within reach;with family/visitor present;in chair     Time: [id number] PT Time Calculation (min): 11 min  Charges:  $Therapeutic Activity: 8-22 mins                    G Codes:      [place], Vasco Chong S 02/06/2014, 2:34 PM  Sunday SpillersLogan Secor Ple[place], [place]PT [phone]

## 2014-02-06 NOTE — Plan of Care (Signed)
Problem: Phase II Progression Outcomes Goal: Progress activity as tolerated unless otherwise ordered Outcome: Progressing     

## 2014-02-06 NOTE — Progress Notes (Signed)
Oob to chair tol well eating on popcycle.

## 2014-02-06 NOTE — Progress Notes (Signed)
PATIENT DETAILS Name: Sheri Mccoy Age: 73 y.o. Sex: female Date of Birth: 04/29/1940 Admit Date: 02/01/2014 Admitting Physician Alison Murray, MD WUJ:[id number] Molly Maduro, MD  Brief Summary: 73 yo female with hx of HTN, Dyslipidemia, recent Right Knee Arthroplasty, recent UTI-admitted with several days hx of nausea-"dry heaving". She was found to have mild ileus of Abd Xray, mild cellulitis of her Right lower extremity. She was admitted for supportive care, IV Abx. Unfortunately, with patient having BM's and inspite of radiologic improvement no improvement in her symptoms. GI was subsequently consulted and underwent EGD (findings below). Now has new ARF and leukocytosis.   Subjective: Still with nausea/dry heaving. Mild epigastric pain this am.  Assessment/Plan: Principal Problem: Nausea/Vomiting:initially felt to be secondary to mild Ileus,however symptoms continuing in spite of radiological improvement and numerous bowel movements.Suspect more of a upper GI cause,? Gastroparesis. Patient has a significant history of reflux/hiatal hernia and underwent Nissen's fundoplication a few years back. Given this history, GI was consulted.Patient underwent endoscopy on 11/3, which showed recurrence of her hiatal hernia.Since continued to have symptoms, have ordered a Gastric Emptying study as well, currently pending. CT Abd and CT chest neg for significant abnormalities. Given persistent symp-have consulted Gen Surgery to see if this is related to recurrence of Hiatal Hernia/?strangulation as patient is s/p Nissen's Fundoplication. Continue with supportive care with IV Abx, IV Antiemetics, await Gastric Emptying study.  Active Problems:   Leukocytosis: new this am-?stress margination-no site of infection apparent-her leg is at baseline (has chronic skin changes. UA not really suggestive of UTI, Urine cs positive for 20 K colonies of multiple bacterial morphotypes. Blood culture 11/1 neg so  far. Plans are to monitor off antibiotics and panculture if febrile.    Minimal Right Leg Cellulitis:significantly improved, per patient-she had chronic mild erythema, leg back to baseline. Empirically started on both vancomycin and Zosyn on admission, Zosyn discontinued on 11/2, since hardly any erythema  Vancomycin stopped  on 11/3.Leg remains stable this am.   ZHY:QMVHQIO secondary to pre-renal process, c/w IVF@ 150 cc/hr. Recent UA neg for proteinuria.Renal Ultrasound neg for hydronephroses. Strict I&O's, hopefully renal function will plateau and start improving soon he did if not will consult nephrology. Clinically remains euvolemic-mouth dry as NPO for gastric emptying study.    Hyponatremia:resolved, secondary to above    Hypokalemia: secondary to above. Will replete and recheck in am.      HTN (hypertension):uncontrolled, continue with Amlodipine, started Coreg yesterday-will follow and titrate accordingly, add prn Hydralazine.Micardis/HCTZ remains on hold.    HLD (hyperlipidemia):will hold statins for now given worsening labs  Disposition: Remain inpatient  Antibiotics:  IV Vancomycin 10/31>>11/4  IV Zosyn 10/31>>11/2  DVT Prophylaxis: Prophylactic Lovenox   Code Status: Full code   Family Communication Spouse at bedside Son-Chris tel-[phone]-will call once Gastric emptying study results available.  Procedures:  None  CONSULTS:  None  MEDICATIONS: Scheduled Meds: . amLODipine  10 mg Oral Daily  . atorvastatin  40 mg Oral Daily  . carvedilol  6.25 mg Oral BID WC  . enoxaparin (LOVENOX) injection  30 mg Subcutaneous Daily  . pantoprazole (PROTONIX) IV  40 mg Intravenous Q12H  . pneumococcal 23 valent vaccine  0.5 mL Intramuscular Tomorrow-1000  . potassium chloride  40 mEq Oral Once  . senna-docusate  2 tablet Oral BID   Continuous Infusions: . sodium chloride 150 mL/hr at 02/05/14 1326   PRN Meds:.acetaminophen, hydrALAZINE, morphine injection,  [DISCONTINUED] ondansetron **OR** ondansetron (  ZOFRAN) IV, phenol, prochlorperazine  Antibiotics: Anti-infectives    Start     Dose/Rate Route Frequency Ordered Stop   02/02/14 2100  piperacillin-tazobactam (ZOSYN) IVPB 3.375 g  Status:  Discontinued     3.375 g12.5 mL/hr over 240 Minutes Intravenous 3 times per day 02/02/14 1747 02/03/14 1034   02/02/14 0800  vancomycin (VANCOCIN) IVPB 1000 mg/200 mL premix  Status:  Discontinued     1,000 mg200 mL/hr over 60 Minutes Intravenous Every 12 hours 02/01/14 2336 02/04/14 1229   02/01/14 2359  piperacillin-tazobactam (ZOSYN) IVPB 3.375 g  Status:  Discontinued     3.375 g12.5 mL/hr over 240 Minutes Intravenous 3 times per day 02/01/14 2336 02/02/14 1747   02/01/14 2345  vancomycin (VANCOCIN) 1,500 mg in sodium chloride 0.9 % 500 mL IVPB     1,500 mg250 mL/hr over 120 Minutes Intravenous  Once 02/01/14 2336 02/02/14 0145   02/01/14 2330  piperacillin-tazobactam (ZOSYN) IVPB 3.375 g  Status:  Discontinued     3.375 g100 mL/hr over 30 Minutes Intravenous  Once 02/01/14 2328 02/01/14 2332   02/01/14 2330  vancomycin (VANCOCIN) IVPB 1000 mg/200 mL premix  Status:  Discontinued     1,000 mg200 mL/hr over 60 Minutes Intravenous  Once 02/01/14 2328 02/01/14 2332   02/01/14 1700  clindamycin (CLEOCIN) IVPB 600 mg     600 mg100 mL/hr over 30 Minutes Intravenous  Once 02/01/14 1658 02/01/14 1757       PHYSICAL EXAM: Vital signs in last 24 hours: Filed Vitals:   02/05/14 2031 02/06/14 0500 02/06/14 0536 02/06/14 0539  BP: 164/74  177/82   Pulse: 71  81   Temp: 98 F (36.7 C)  97.6 F (36.4 C)   TempSrc: Oral  Oral   Resp:      Height:  5\' 4"  (1.626 m)    Weight:  96.1 kg (211 lb 13.8 oz)  97.2 kg (214 lb 4.6 oz)  SpO2: 97%  96%     Weight change: 0.3 kg (10.6 oz) Filed Weights   02/05/14 0652 02/06/14 0500 02/06/14 0539  Weight: 95.8 kg (211 lb 3.2 oz) 96.1 kg (211 lb 13.8 oz) 97.2 kg (214 lb 4.6 oz)   Body mass index is 36.76 kg/(m^2).    Gen Exam: Awake and alert with clear speech.Not in any distress Neck: Supple, No JVD.   Chest: B/L Clear.  No rales or rhonchi CVS: S1 S2 Regular, no murmurs.  Abdomen: soft, BS +, very mildly tender epigastric area, non distended.  Extremities: no edema, lower extremities warm to touch.Mild chronic erythema to right leg. Neurologic: Non Focal.   Skin: No Rash.   Wounds: N/A.    Intake/Output from previous day:  Intake/Output Summary (Last 24 hours) at 02/06/14 1129 Last data filed at 02/06/14 0500  Gross per 24 hour  Intake   2385 ml  Output      0 ml  Net   2385 ml     LAB RESULTS: CBC  Recent Labs Lab 02/01/14 1455 02/02/14 0619 02/03/14 0540 02/05/14 0859 02/06/14 0411  WBC 9.9 5.7 8.0 15.3* 13.3*  HGB 11.6* 10.1* 10.9* 12.8 11.0*  HCT 35.9* 31.6* 34.3* 39.4 34.1*  PLT 274 221 274 368 315  MCV 87.6 88.0 88.6 87.0 87.0  MCH 28.3 28.1 28.2 28.3 28.1  MCHC 32.3 32.0 31.8 32.5 32.3  RDW 14.1 14.3 14.3 14.3 14.4  LYMPHSABS 0.4*  --   --   --   --   MONOABS 0.2  --   --   --   --  EOSABS 0.0  --   --   --   --   BASOSABS 0.0  --   --   --   --     Chemistries   Recent Labs Lab 02/03/14 0540 02/04/14 0621 02/05/14 0553 02/05/14 0859 02/06/14 0411  NA 142 140 140 139 139  K 3.6* 2.6* 3.0* 2.9* 3.0*  CL 103 97 99 97 99  CO2 26 28 24 24 23   GLUCOSE 110* 108* 116* 122* 91  BUN 20 15 22 22  25*  CREATININE 0.91 1.27* 2.54* 2.55* 2.83*  CALCIUM 8.3* 8.7 8.4 8.6 7.8*  MG 2.4  --   --   --   --     CBG: No results for input(s): GLUCAP in the last 168 hours.  GFR Estimated Creatinine Clearance: 20.3 mL/min (by C-G formula based on Cr of 2.83).  Coagulation profile No results for input(s): INR, PROTIME in the last 168 hours.  Cardiac Enzymes  Recent Labs Lab 02/05/14 0859  TROPONINI <0.30    Invalid input(s): POCBNP No results for input(s): DDIMER in the last 72 hours. No results for input(s): HGBA1C in the last 72 hours. No results for  input(s): CHOL, HDL, LDLCALC, TRIG, CHOLHDL, LDLDIRECT in the last 72 hours. No results for input(s): TSH, T4TOTAL, T3FREE, THYROIDAB in the last 72 hours.  Invalid input(s): FREET3 No results for input(s): VITAMINB12, FOLATE, FERRITIN, TIBC, IRON, RETICCTPCT in the last 72 hours.  Recent Labs  02/05/14 0859  LIPASE 22    Urine Studies No results for input(s): UHGB, CRYS in the last 72 hours.  Invalid input(s): UACOL, UAPR, USPG, UPH, UTP, UGL, UKET, UBIL, UNIT, UROB, ULEU, UEPI, UWBC, URBC, UBAC, CAST, UCOM, BILUA  MICROBIOLOGY: Recent Results (from the past 240 hour(s))  Urine culture     Status: None   Collection Time: 02/01/14  2:56 PM  Result Value Ref Range Status   Specimen Description URINE, CLEAN CATCH  Final   Special Requests NONE  Final   Culture  Setup Time   Final    02/01/2014 23:54 Performed at [facility]    Colony Count   Final    20,OOO COLONIES/ML Performed at [facility]    Culture   Final    Multiple bacterial morphotypes present, none predominant. Suggest appropriate recollection if clinically indicated. Performed at [facility]    Report Status 02/02/2014 FINAL  Final  Culture, blood (routine x 2)     Status: None (Preliminary result)   Collection Time: 02/02/14 12:43 AM  Result Value Ref Range Status   Specimen Description BLOOD RIGHT HAND  Final   Special Requests BOTTLES DRAWN AEROBIC AND ANAEROBIC 4CC EA  Final   Culture  Setup Time   Final    02/02/2014 18:00 Performed at [facility]    Culture   Final           BLOOD CULTURE RECEIVED NO GROWTH TO DATE CULTURE WILL BE HELD FOR 5 DAYS BEFORE ISSUING A FINAL NEGATIVE REPORT Performed at [facility]    Report Status PENDING  Incomplete  Culture, blood (routine x 2)     Status: None (Preliminary result)   Collection Time: 02/02/14 12:48 AM  Result Value Ref Range Status   Specimen Description BLOOD LEFT HAND  Final   Special Requests BOTTLES  DRAWN AEROBIC AND ANAEROBIC 5CC EA  Final   Culture  Setup Time   Final    02/02/2014 18:00 Performed at [facility]    Culture   Final           BLOOD CULTURE RECEIVED NO GROWTH TO DATE CULTURE WILL BE HELD FOR 5 DAYS BEFORE ISSUING A FINAL NEGATIVE REPORT Performed at [facility]    Report Status PENDING  Incomplete    RADIOLOGY STUDIES/RESULTS: Ct Abdomen Pelvis Wo Contrast  02/05/2014   CLINICAL DATA:  Upper abdominal pain and chest pain for a few days with nausea and vomiting.  EXAM: CT CHEST, ABDOMEN AND PELVIS WITHOUT CONTRAST  TECHNIQUE: Multidetector CT imaging of the chest, abdomen and pelvis was performed following the standard protocol without IV contrast.  COMPARISON:  None.  FINDINGS: CT CHEST FINDINGS  Lungs are adequately inflated and demonstrate minimal linear scarring/ atelectasis over the posterior bases. Heart is normal in size. Subtle possible calcification over the left anterior descending coronary artery. Minimal calcified plaque involving thoracic aorta. No evidence of hilar or axillary adenopathy. There is a moderate size hiatal hernia. There is a small contrast collection along the inferior aspect of the diaphragmatic hiatus at the level of the hiatal hernia likely related patient's prior Nissen fundoplication.  CT ABDOMEN AND PELVIS FINDINGS  Abdominal images demonstrate evidence of a prior cholecystectomy. There is fatty atrophy of the pancreas. The spleen, liver and adrenal glands are within normal. Kidneys are normal in size, shape and position without evidence of nephrolithiasis. No evidence of obstruction. Ureters are within normal. Note that the mid to distal ureters are difficult to visualize due to moderate streak artifact from patient's left hip prosthesis as well as barium from recent fluoroscopic evaluation. There is mild diverticulosis of the colon. Appendix is not definitely seen as there is moderate streak artifact over the cecum. There is  calcified plaque over the abdominal aorta and iliac arteries. No evidence of bowel obstruction.  Subtle wall thickening involving the 3rd to 4th portion of the duodenum and proximal jejunum with very minimal ill definition of the fat planes adjacent the 3rd to 4th portion of the duodenum as cannot exclude peptic ulcer disease or regional enteritis.  Pelvic images demonstrate a calcified fibroid over the uterine fundus. There is a tiny amount of free fluid over the posterior pelvis. Bladder and rectum are otherwise unremarkable. There are mild degenerative changes of the spine and right hip.  IMPRESSION: Exam is somewhat limited due to streak artifact from retained barium and left hip prosthesis. There is subtle ill definition of the fat planes adjacent the 3rd to 4th portion of the duodenum with minimal wall thickening of this segment as well as proximal jejunum as cannot exclude peptic ulcer disease or regional enteritis.  No acute findings in the chest or pelvis.  Moderate size hiatal hernia with small contained contrast collection at the surgical site in this patient with prior Nissen fundoplication.  Minimal diverticulosis of the colon.  Tiny amount of nonspecific free fluid in the pelvis.   Electronically Signed   By: Elberta Fortisaniel  Boyle M.D.   On: 02/05/2014 13:08   Dg Chest 2 View  02/01/2014   CLINICAL DATA:  Cough.  EXAM: CHEST  2 VIEW  COMPARISON:  12/12/2013  FINDINGS: Borderline cardiomegaly remains stable. Mild bibasilar scarring is also unchanged. No evidence of pulmonary infiltrate or edema. No evidence of pleural effusion.  IMPRESSION: Stable exam.  No active disease.   Electronically Signed   By: Myles RosenthalJohn  Stahl M.D.   On: 02/01/2014 15:40   Ct Chest Wo Contrast  02/05/2014   CLINICAL  DATA:  Upper abdominal pain and chest pain for a few days with nausea and vomiting.  EXAM: CT CHEST, ABDOMEN AND PELVIS WITHOUT CONTRAST  TECHNIQUE: Multidetector CT imaging of the chest, abdomen and pelvis was performed  following the standard protocol without IV contrast.  COMPARISON:  None.  FINDINGS: CT CHEST FINDINGS  Lungs are adequately inflated and demonstrate minimal linear scarring/ atelectasis over the posterior bases. Heart is normal in size. Subtle possible calcification over the left anterior descending coronary artery. Minimal calcified plaque involving thoracic aorta. No evidence of hilar or axillary adenopathy. There is a moderate size hiatal hernia. There is a small contrast collection along the inferior aspect of the diaphragmatic hiatus at the level of the hiatal hernia likely related patient's prior Nissen fundoplication.  CT ABDOMEN AND PELVIS FINDINGS  Abdominal images demonstrate evidence of a prior cholecystectomy. There is fatty atrophy of the pancreas. The spleen, liver and adrenal glands are within normal. Kidneys are normal in size, shape and position without evidence of nephrolithiasis. No evidence of obstruction. Ureters are within normal. Note that the mid to distal ureters are difficult to visualize due to moderate streak artifact from patient's left hip prosthesis as well as barium from recent fluoroscopic evaluation. There is mild diverticulosis of the colon. Appendix is not definitely seen as there is moderate streak artifact over the cecum. There is calcified plaque over the abdominal aorta and iliac arteries. No evidence of bowel obstruction.  Subtle wall thickening involving the 3rd to 4th portion of the duodenum and proximal jejunum with very minimal ill definition of the fat planes adjacent the 3rd to 4th portion of the duodenum as cannot exclude peptic ulcer disease or regional enteritis.  Pelvic images demonstrate a calcified fibroid over the uterine fundus. There is a tiny amount of free fluid over the posterior pelvis. Bladder and rectum are otherwise unremarkable. There are mild degenerative changes of the spine and right hip.  IMPRESSION: Exam is somewhat limited due to streak artifact  from retained barium and left hip prosthesis. There is subtle ill definition of the fat planes adjacent the 3rd to 4th portion of the duodenum with minimal wall thickening of this segment as well as proximal jejunum as cannot exclude peptic ulcer disease or regional enteritis.  No acute findings in the chest or pelvis.  Moderate size hiatal hernia with small contained contrast collection at the surgical site in this patient with prior Nissen fundoplication.  Minimal diverticulosis of the colon.  Tiny amount of nonspecific free fluid in the pelvis.   Electronically Signed   By: Elberta Fortis M.D.   On: 02/05/2014 13:08   Dg Esophagus  02/04/2014   CLINICAL DATA:  Nausea and vomiting. Previous Nissen fundoplication. Question recurrent hiatal hernia or volvulus on endoscopy.  EXAM: ESOPHOGRAM/BARIUM SWALLOW  TECHNIQUE: Single contrast examination was performed using  thin barium.  FLUOROSCOPY TIME:  1 min 40 seconds  COMPARISON:  None.  FINDINGS: Impression on the distal esophagus is seen consistent with history of previous Nissen fundoplication. A small paraesophageal hernia is seen at this site, which may be due to loosening of the fundoplication wrap.  There is no evidence of esophageal obstruction or mass. There is no evidence of volvulus. Mild dilatation of thoracic esophagus is seen with severe dysmotility including tertiary contractions.  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 No evidence of esophageal mass, obstruction, or volvulus.  Esophageal dysmotility.  Electronically Signed   By: Myles Rosenthal M.D.   On: 02/04/2014 17:08   US Renal  02/06/2014   CLINICAL DATA:  Acute renal failure.  EXAM: RENAL/URINARY TRACT ULTRASOUND COMPLETE  COMPARISON:  CT abdomen and pelvis 02/05/2014 and abdominal ultrasound 05/15/2007  FINDINGS: Right Kidney:  Length: 13.3 cm. Echogenicity within normal limits. No mass or  hydronephrosis visualized.  Left Kidney:  Length: 14.3 cm. Echogenicity within normal limits. No mass or hydronephrosis visualized.  Bladder:  Appears normal for degree of bladder distention.  IMPRESSION: No hydronephrosis or other abnormality identified.   Electronically Signed   By: Sebastian Ache   On: 02/06/2014 09:36   Dg Abd 2 Views  02/03/2014   CLINICAL DATA:  Vomiting.  EXAM: ABDOMEN - 2 VIEW  COMPARISON:  Same day.  FINDINGS: The bowel gas pattern is normal. Status post cholecystectomy. There is no evidence of free air. No radio-opaque calculi or other significant radiographic abnormality is seen.  IMPRESSION: Bowel distention noted on prior exam has resolved. There is no evidence of bowel obstruction or ileus.   Electronically Signed   By: Roque Lias M.D.   On: 02/03/2014 10:21   Dg Abd Portable 1v  02/02/2014   CLINICAL DATA:  Abdominal pain.  EXAM: PORTABLE ABDOMEN - 1 VIEW  COMPARISON:  No prior .  FINDINGS: Soft tissue structures are unremarkable. Slightly distended air-filled loops of small large bowel noted. These findings most consistent with adynamic ileus. Surgical clips right upper quadrant consistent with cholecystectomy. Left hip replacement. Pelvic phleboliths. Degenerative changes lumbar spine right hip.  IMPRESSION: Findings most consistent with mild adynamic ileus.   Electronically Signed   By: Maisie Fus  Register   On: 02/02/2014 08:31    Jeoffrey Massed, MD  [facility] Pager:[phone]  If 7PM-7AM, please contact night-coverage [email or web address] Password [email or web address] 02/06/2014, 11:29 AM   LOS: 5 days

## 2014-02-06 NOTE — Plan of Care (Signed)
Problem: Phase II Progression Outcomes Goal: Vital signs remain stable Outcome: Completed/Met Date Met:  02/06/14     

## 2014-02-07 LAB — CBC
HEMATOCRIT: 35.9 % — AB (ref 36.0–46.0)
Hemoglobin: 11.5 g/dL — ABNORMAL LOW (ref 12.0–15.0)
MCH: 28.3 pg (ref 26.0–34.0)
MCHC: 32 g/dL (ref 30.0–36.0)
MCV: 88.4 fL (ref 78.0–100.0)
PLATELETS: 355 10*3/uL (ref 150–400)
RBC: 4.06 MIL/uL (ref 3.87–5.11)
RDW: 14.2 % (ref 11.5–15.5)
WBC: 16.7 10*3/uL — AB (ref 4.0–10.5)

## 2014-02-07 LAB — BASIC METABOLIC PANEL
ANION GAP: 16 — AB (ref 5–15)
BUN: 23 mg/dL (ref 6–23)
CALCIUM: 7.8 mg/dL — AB (ref 8.4–10.5)
CO2: 22 mEq/L (ref 19–32)
Chloride: 101 mEq/L (ref 96–112)
Creatinine, Ser: 2.57 mg/dL — ABNORMAL HIGH (ref 0.50–1.10)
GFR, EST AFRICAN AMERICAN: 20 mL/min — AB (ref >90)
GFR, EST NON AFRICAN AMERICAN: 18 mL/min — AB (ref >90)
Glucose, Bld: 109 mg/dL — ABNORMAL HIGH (ref 70–99)
Potassium: 3 mEq/L — ABNORMAL LOW (ref 3.7–5.3)
SODIUM: 139 meq/L (ref 137–147)

## 2014-02-07 MED ORDER — SODIUM CHLORIDE 0.9 % IV SOLN
INTRAVENOUS | Status: DC
  Administered 2014-02-07 – 2014-02-08 (×3): via INTRAVENOUS
  Filled 2014-02-07 (×6): qty 1000

## 2014-02-07 MED ORDER — POTASSIUM CHLORIDE 10 MEQ/100ML IV SOLN
10.0000 meq | INTRAVENOUS | Status: AC
  Administered 2014-02-07 (×2): 10 meq via INTRAVENOUS
  Filled 2014-02-07 (×3): qty 100

## 2014-02-07 NOTE — Plan of Care (Signed)
Problem: Phase III Progression Outcomes Goal: Pain controlled on oral analgesia Outcome: Progressing Goal: Activity at appropriate level-compared to baseline (UP IN CHAIR FOR HEMODIALYSIS)  Outcome: Progressing

## 2014-02-07 NOTE — Plan of Care (Signed)
Problem: Phase II Progression Outcomes Goal: Progress activity as tolerated unless otherwise ordered Outcome: Completed/Met Date Met:  02/07/14 Goal: Discharge plan established Outcome: Completed/Met Date Met:  02/07/14 Goal: Obtain order to discontinue catheter if appropriate Outcome: Not Applicable Date Met:  36/68/15 Goal: Other Phase II Outcomes/Goals Outcome: Not Applicable Date Met:  94/70/76

## 2014-02-07 NOTE — Progress Notes (Signed)
[facility] Progress Note  Subjective: Patient feeling a little better today, toleratingliquid diet without vomiting, chest and epigastric pain somewhat better  Objective: Vital signs in last 24 hours: Temp:  [98 F (36.7 C)-98.2 F (36.8 C)] 98 F (36.7 C) (11/06 1335) Pulse Rate:  [71-88] 83 (11/06 1335) Resp:  [16] 16 (11/06 1335) BP: (146-181)/(64-88) 152/70 mmHg (11/06 1335) SpO2:  [96 %-97 %] 97 % (11/06 1335) Weight:  [98.3 kg (216 lb 11.4 oz)-98.4 kg (216 lb 14.9 oz)] 98.3 kg (216 lb 11.4 oz) (11/06 1335) Weight change: 2.3 kg (5 lb 1.1 oz)   WU:[id number]:unchanged  Lab Results: Results for orders placed or performed during the hospital encounter of 02/01/14 (from the past 24 hour(s))  Urinalysis, Routine w reflex microscopic     Status: None   Collection Time: 02/06/14  5:44 PM  Result Value Ref Range   Color, Urine YELLOW YELLOW   APPearance CLEAR CLEAR   Specific Gravity, Urine 1.008 1.005 - 1.030   pH 6.0 5.0 - 8.0   Glucose, UA NEGATIVE NEGATIVE mg/dL   Hgb urine dipstick NEGATIVE NEGATIVE   Bilirubin Urine NEGATIVE NEGATIVE   Ketones, ur NEGATIVE NEGATIVE mg/dL   Protein, ur NEGATIVE NEGATIVE mg/dL   Urobilinogen, UA 0.2 0.0 - 1.0 mg/dL   Nitrite NEGATIVE NEGATIVE   Leukocytes, UA NEGATIVE NEGATIVE  Basic metabolic panel     Status: Abnormal   Collection Time: 02/07/14  6:10 AM  Result Value Ref Range   Sodium 139 137 - 147 mEq/L   Potassium 3.0 (L) 3.7 - 5.3 mEq/L   Chloride 101 96 - 112 mEq/L   CO2 22 19 - 32 mEq/L   Glucose, Bld 109 (H) 70 - 99 mg/dL   BUN 23 6 - 23 mg/dL   Creatinine, Ser 2.952.57 (H) 0.50 - 1.10 mg/dL   Calcium 7.8 (L) 8.4 - 10.5 mg/dL   GFR calc non Af Amer 18 (L) >90 mL/min   GFR calc Af Amer 20 (L) >90 mL/min   Anion gap 16 (H) 5 - 15  CBC     Status: Abnormal   Collection Time: 02/07/14  6:10 AM  Result Value Ref Range   WBC 16.7 (H) 4.0 - 10.5 K/uL   RBC 4.06 3.87 - 5.11 MIL/uL   Hemoglobin 11.5 (L) 12.0 - 15.0 g/dL   HCT  62.135.9 (L) 30.836.0 - 46.0 %   MCV 88.4 78.0 - 100.0 fL   MCH 28.3 26.0 - 34.0 pg   MCHC 32.0 30.0 - 36.0 g/dL   RDW 65.714.2 84.611.5 - 96.215.5 %   Platelets 355 150 - 400 K/uL    Studies/Results: Ct Head Wo Contrast  02/06/2014   CLINICAL DATA:  Persistent nausea of uncertain etiology, history hypertension  EXAM: CT HEAD WITHOUT CONTRAST  TECHNIQUE: Contiguous axial images were obtained from the base of the skull through the vertex without intravenous contrast.  COMPARISON:  None.  FINDINGS: There is mild diffuse, confluent, periventricular white matter hypodensity, compatible with chronic small vessel white matter disease.  The gray-white differentiation is preserved.  There is no evidence of an acute infarct.  There is no acute intracranial hemorrhage.  There is no midline shift or mass effect.  There is no extra-axial fluid collection.  There is mild cerebral atrophy.  The paranasal sinuses and mastoid air cells are aerated.  The orbits are intact.  IMPRESSION: No acute intracranial process.   Electronically Signed   By: Fannie KneeKenneth  Crosby   On: 02/06/2014  13:56   Nm Gastric Emptying  02/06/2014   CLINICAL DATA:  Left upper quadrant abdominal pain. Nausea and vomiting.  EXAM: NUCLEAR MEDICINE GASTRIC EMPTYING SCAN  TECHNIQUE: After oral ingestion of radiolabeled meal, sequential abdominal images were obtained for 120 minutes. Residual percentage of activity remaining within the stomach was calculated at 60 and 120 minutes.  RADIOPHARMACEUTICALS:  2 mCi technetium 99-m labeled sulfur colloid  COMPARISON:  Upper GI 05/23/2007 and CT abdomen and pelvis 02/05/2014  FINDINGS: Expected location of the stomach in the left upper quadrant. Ingested meal empties the stomach gradually over the course of the study with 27% retention at 60 min and 30% retention at 120 min (normal retention less than 30% at a 120 min).  IMPRESSION: Gastric emptying within normal limits.   Electronically Signed   By: Sebastian AcheAllen  Grady   On: 02/06/2014  12:34   [place] Renal  02/06/2014   CLINICAL DATA:  Acute renal failure.  EXAM: RENAL/URINARY TRACT ULTRASOUND COMPLETE  COMPARISON:  CT abdomen and pelvis 02/05/2014 and abdominal ultrasound 05/15/2007  FINDINGS: Right Kidney:  Length: 13.3 cm. Echogenicity within normal limits. No mass or hydronephrosis visualized.  Left Kidney:  Length: 14.3 cm. Echogenicity within normal limits. No mass or hydronephrosis visualized.  Bladder:  Appears normal for degree of bladder distention.  IMPRESSION: No hydronephrosis or other abnormality identified.   Electronically Signed   By: Sebastian AcheAllen  Grady   On: 02/06/2014 09:36      Assessment: Nausea vomiting epigastric and chest pain etiology still not entirely clear History of Nissen fundoplication with some loosening and possible small paraesophageal component EGD, barium swallow, abdominal and chest CT scan and gastric emptying study otherwise unrevealing.   Plan: Reglan 5 mg 3 times a day has been started and may be associated with some improvement. Would continue this and proton pump inhibitor, possibly increasing Reglan to 10 mg through 4 times a day if no side effects. And advance diet as tolerated. Specific further GI workup planned at this time. We'll continue to follow.   Darin Arndt C 02/07/2014, 1:56 PM

## 2014-02-07 NOTE — Progress Notes (Signed)
PATIENT DETAILS Name: Sheri Mccoy Age: 73 y.o. Sex: female Date of Birth: Aug 31, 1940 Admit Date: 02/01/2014 Admitting Physician Alison Murray, MD WUJ:[id number],NWGNFAO Molly Maduro, MD  Brief Summary: 73 yo female with hx of HTN, Dyslipidemia, recent Right Knee Arthroplasty, recent UTI-admitted with several days hx of nausea-"dry heaving". She was found to have mild ileus of Abd Xray, mild cellulitis of her Right lower extremity. She was admitted for supportive care, IV Abx. Unfortunately, with patient having BM's and inspite of radiologic improvement no improvement in her symptoms. GI was subsequently consulted and underwent EGD (findings below). Now has new ARF and leukocytosis.   Subjective: Still with nausea/dry heaving. Mild epigastric pain this am.  Assessment/Plan: Principal Problem: Nausea/Vomiting:initially felt to be secondary to mild Ileus,however symptoms continuing in spite of radiological improvement and numerous bowel movements.Suspect more of a upper GI cause,? Gastroparesis. Patient has a significant history of reflux/hiatal hernia and underwent Nissen's fundoplication a few years back. Given this history, GI was consulted.Patient underwent endoscopy on 11/3, which showed recurrence of her hiatal hernia.Since continued to have symptoms, Gastric Emptying study was done, and was negative.  CT Abd and CT chest neg for significant abnormalities.GI and Gen Surgery were consulted and following. Per General urgery symptoms unlikely related to recurrence of Hiatal Hernia. Genera surgery feels unlikely that there is any strangulation of the hiatal hernia.Fortunately slowly improving with supportive care, will advance to full liquids today.  Active Problems:   Leukocytosis: ?stress margination-no site of infection apparent-her leg is at baseline (has chronic skin changes. UA not really suggestive of UTI, Urine cs positive for 20 K colonies of multiple bacterial morphotypes. Blood  culture 11/1 neg so far. Plans are to monitor off antibiotics and panculture if febrile, however will repeat blood culture on 11/6.    Minimal Right Leg Cellulitis:significantly improved, per patient-she had chronic mild erythema, leg back to baseline. Empirically started on both vancomycin and Zosyn on admission, Zosyn discontinued on 11/2, since hardly any erythema  Vancomycin stopped  on 11/3.Leg remains stable this am.   ZHY:QMVHQIO secondary to pre-renal process. Recent UA neg for proteinuria.Renal Ultrasound neg for hydronephroses. Strict I&O's, creatinine now downtrending, euvolemic on exam, decrease IVF-now tolerating clear liquids.    Hyponatremia:resolved, secondary to above    Hypokalemia: secondary to above. Will replete and recheck in am.Add K to IVF      HTN (hypertension):better, continue with Amlodipine,  Coreg-will follow and titrate accordingly, add prn Hydralazine.Micardis/HCTZ remains on hold.    HLD (hyperlipidemia):will hold statins for now given worsening labs  Disposition: Remain inpatient  Antibiotics:  IV Vancomycin 10/31>>11/4  IV Zosyn 10/31>>11/2  DVT Prophylaxis: Prophylactic Lovenox   Code Status: Full code   Family Communication Son-Chris tel-[phone]  Procedures:  None  CONSULTS:  None  MEDICATIONS: Scheduled Meds: . amLODipine  10 mg Oral Daily  . atorvastatin  40 mg Oral Daily  . carvedilol  12.5 mg Oral BID WC  . enoxaparin (LOVENOX) injection  30 mg Subcutaneous Daily  . metoCLOPramide (REGLAN) injection  5 mg Intravenous TID AC & HS  . pantoprazole (PROTONIX) IV  40 mg Intravenous Q12H  . pneumococcal 23 valent vaccine  0.5 mL Intramuscular Tomorrow-1000  . potassium chloride  40 mEq Oral Once  . senna-docusate  2 tablet Oral BID   Continuous Infusions: . sodium chloride 100 mL/hr (02/07/14 1005)   PRN Meds:.acetaminophen, hydrALAZINE, morphine injection, [DISCONTINUED] ondansetron **OR** ondansetron (ZOFRAN) IV, phenol,  prochlorperazine  Antibiotics: Anti-infectives    Start     Dose/Rate Route Frequency Ordered Stop   02/02/14 2100  piperacillin-tazobactam (ZOSYN) IVPB 3.375 g  Status:  Discontinued     3.375 g12.5 mL/hr over 240 Minutes Intravenous 3 times per day 02/02/14 1747 02/03/14 1034   02/02/14 0800  vancomycin (VANCOCIN) IVPB 1000 mg/200 mL premix  Status:  Discontinued     1,000 mg200 mL/hr over 60 Minutes Intravenous Every 12 hours 02/01/14 2336 02/04/14 1229   02/01/14 2359  piperacillin-tazobactam (ZOSYN) IVPB 3.375 g  Status:  Discontinued     3.375 g12.5 mL/hr over 240 Minutes Intravenous 3 times per day 02/01/14 2336 02/02/14 1747   02/01/14 2345  vancomycin (VANCOCIN) 1,500 mg in sodium chloride 0.9 % 500 mL IVPB     1,500 mg250 mL/hr over 120 Minutes Intravenous  Once 02/01/14 2336 02/02/14 0145   02/01/14 2330  piperacillin-tazobactam (ZOSYN) IVPB 3.375 g  Status:  Discontinued     3.375 g100 mL/hr over 30 Minutes Intravenous  Once 02/01/14 2328 02/01/14 2332   02/01/14 2330  vancomycin (VANCOCIN) IVPB 1000 mg/200 mL premix  Status:  Discontinued     1,000 mg200 mL/hr over 60 Minutes Intravenous  Once 02/01/14 2328 02/01/14 2332   02/01/14 1700  clindamycin (CLEOCIN) IVPB 600 mg     600 mg100 mL/hr over 30 Minutes Intravenous  Once 02/01/14 1658 02/01/14 1757       PHYSICAL EXAM: Vital signs in last 24 hours: Filed Vitals:   02/07/14 0500 02/07/14 0511 02/07/14 0824 02/07/14 0900  BP:  170/74 160/88 146/74  Pulse:  81 88 75  Temp:  98.1 F (36.7 C)  98.2 F (36.8 C)  TempSrc:    Oral  Resp:  16  16  Height:      Weight: 98.4 kg (216 lb 14.9 oz)     SpO2:  96%  97%    Weight change: 2.3 kg (5 lb 1.1 oz) Filed Weights   02/06/14 0500 02/06/14 0539 02/07/14 0500  Weight: 96.1 kg (211 lb 13.8 oz) 97.2 kg (214 lb 4.6 oz) 98.4 kg (216 lb 14.9 oz)   Body mass index is 37.22 kg/(m^2).   Gen Exam: Awake and alert with clear speech.Not in any distress Neck: Supple, No JVD.     Chest: B/L Clear.  No rales or rhonchi CVS: S1 S2 Regular, no murmurs.  Abdomen: soft, BS +, non tender epigastric area, non distended.  Extremities: no edema, lower extremities warm to touch.Mild chronic erythema to right leg. Neurologic: Non Focal.   Skin: No Rash.   Wounds: N/A.    Intake/Output from previous day:  Intake/Output Summary (Last 24 hours) at 02/07/14 1040 Last data filed at 02/07/14 0900  Gross per 24 hour  Intake   2800 ml  Output   2150 ml  Net    650 ml     LAB RESULTS: CBC  Recent Labs Lab 02/01/14 1455 02/02/14 0619 02/03/14 0540 02/05/14 0859 02/06/14 0411 02/07/14 0610  WBC 9.9 5.7 8.0 15.3* 13.3* 16.7*  HGB 11.6* 10.1* 10.9* 12.8 11.0* 11.5*  HCT 35.9* 31.6* 34.3* 39.4 34.1* 35.9*  PLT 274 221 274 368 315 355  MCV 87.6 88.0 88.6 87.0 87.0 88.4  MCH 28.3 28.1 28.2 28.3 28.1 28.3  MCHC 32.3 32.0 31.8 32.5 32.3 32.0  RDW 14.1 14.3 14.3 14.3 14.4 14.2  LYMPHSABS 0.4*  --   --   --   --   --   MONOABS 0.2  --   --   --   --   --  EOSABS 0.0  --   --   --   --   --   BASOSABS 0.0  --   --   --   --   --     Chemistries   Recent Labs Lab 02/03/14 0540 02/04/14 0621 02/05/14 0553 02/05/14 0859 02/06/14 0411 02/07/14 0610  NA 142 140 140 139 139 139  K 3.6* 2.6* 3.0* 2.9* 3.0* 3.0*  CL 103 97 99 97 99 101  CO2 26 28 24 24 23 22   GLUCOSE 110* 108* 116* 122* 91 109*  BUN 20 15 22 22  25* 23  CREATININE 0.91 1.27* 2.54* 2.55* 2.83* 2.57*  CALCIUM 8.3* 8.7 8.4 8.6 7.8* 7.8*  MG 2.4  --   --   --   --   --     CBG: No results for input(s): GLUCAP in the last 168 hours.  GFR Estimated Creatinine Clearance: 22.6 mL/min (by C-G formula based on Cr of 2.57).  Coagulation profile No results for input(s): INR, PROTIME in the last 168 hours.  Cardiac Enzymes  Recent Labs Lab 02/05/14 0859  TROPONINI <0.30    Invalid input(s): POCBNP No results for input(s): DDIMER in the last 72 hours. No results for input(s): HGBA1C in the  last 72 hours. No results for input(s): CHOL, HDL, LDLCALC, TRIG, CHOLHDL, LDLDIRECT in the last 72 hours. No results for input(s): TSH, T4TOTAL, T3FREE, THYROIDAB in the last 72 hours.  Invalid input(s): FREET3 No results for input(s): VITAMINB12, FOLATE, FERRITIN, TIBC, IRON, RETICCTPCT in the last 72 hours.  Recent Labs  02/05/14 0859  LIPASE 22    Urine Studies No results for input(s): UHGB, CRYS in the last 72 hours.  Invalid input(s): UACOL, UAPR, USPG, UPH, UTP, UGL, UKET, UBIL, UNIT, UROB, ULEU, UEPI, UWBC, URBC, UBAC, CAST, UCOM, BILUA  MICROBIOLOGY: Recent Results (from the past 240 hour(s))  Urine culture     Status: None   Collection Time: 02/01/14  2:56 PM  Result Value Ref Range Status   Specimen Description URINE, CLEAN CATCH  Final   Special Requests NONE  Final   Culture  Setup Time   Final    02/01/2014 23:54 Performed at [facility] Count   Final    20,OOO COLONIES/ML Performed at [facility]    Culture   Final    Multiple bacterial morphotypes present, none predominant. Suggest appropriate recollection if clinically indicated. Performed at [facility]    Report Status 02/02/2014 FINAL  Final  Culture, blood (routine x 2)     Status: None (Preliminary result)   Collection Time: 02/02/14 12:43 AM  Result Value Ref Range Status   Specimen Description BLOOD RIGHT HAND  Final   Special Requests BOTTLES DRAWN AEROBIC AND ANAEROBIC 4CC EA  Final   Culture  Setup Time   Final    02/02/2014 18:00 Performed at [facility]    Culture   Final           BLOOD CULTURE RECEIVED NO GROWTH TO DATE CULTURE WILL BE HELD FOR 5 DAYS BEFORE ISSUING A FINAL NEGATIVE REPORT Performed at [facility]    Report Status PENDING  Incomplete  Culture, blood (routine x 2)     Status: None (Preliminary result)   Collection Time: 02/02/14 12:48 AM  Result Value Ref Range Status   Specimen Description BLOOD LEFT HAND   Final   Special Requests BOTTLES DRAWN AEROBIC AND ANAEROBIC 5CC EA  Final   Culture  Setup Time   Final    02/02/2014 18:00 Performed at [facility]    Culture   Final           BLOOD CULTURE RECEIVED NO GROWTH TO DATE CULTURE WILL BE HELD FOR 5 DAYS BEFORE ISSUING A FINAL NEGATIVE REPORT Performed at [facility]    Report Status PENDING  Incomplete    RADIOLOGY STUDIES/RESULTS: Ct Abdomen Pelvis Wo Contrast  02/05/2014   CLINICAL DATA:  Upper abdominal pain and chest pain for a few days with nausea and vomiting.  EXAM: CT CHEST, ABDOMEN AND PELVIS WITHOUT CONTRAST  TECHNIQUE: Multidetector CT imaging of the chest, abdomen and pelvis was performed following the standard protocol without IV contrast.  COMPARISON:  None.  FINDINGS: CT CHEST FINDINGS  Lungs are adequately inflated and demonstrate minimal linear scarring/ atelectasis over the posterior bases. Heart is normal in size. Subtle possible calcification over the left anterior descending coronary artery. Minimal calcified plaque involving thoracic aorta. No evidence of hilar or axillary adenopathy. There is a moderate size hiatal hernia. There is a small contrast collection along the inferior aspect of the diaphragmatic hiatus at the level of the hiatal hernia likely related patient's prior Nissen fundoplication.  CT ABDOMEN AND PELVIS FINDINGS  Abdominal images demonstrate evidence of a prior cholecystectomy. There is fatty atrophy of the pancreas. The spleen, liver and adrenal glands are within normal. Kidneys are normal in size, shape and position without evidence of nephrolithiasis. No evidence of obstruction. Ureters are within normal. Note that the mid to distal ureters are difficult to visualize due to moderate streak artifact from patient's left hip prosthesis as well as barium from recent fluoroscopic evaluation. There is mild diverticulosis of the colon. Appendix is not definitely seen as there is moderate streak  artifact over the cecum. There is calcified plaque over the abdominal aorta and iliac arteries. No evidence of bowel obstruction.  Subtle wall thickening involving the 3rd to 4th portion of the duodenum and proximal jejunum with very minimal ill definition of the fat planes adjacent the 3rd to 4th portion of the duodenum as cannot exclude peptic ulcer disease or regional enteritis.  Pelvic images demonstrate a calcified fibroid over the uterine fundus. There is a tiny amount of free fluid over the posterior pelvis. Bladder and rectum are otherwise unremarkable. There are mild degenerative changes of the spine and right hip.  IMPRESSION: Exam is somewhat limited due to streak artifact from retained barium and left hip prosthesis. There is subtle ill definition of the fat planes adjacent the 3rd to 4th portion of the duodenum with minimal wall thickening of this segment as well as proximal jejunum as cannot exclude peptic ulcer disease or regional enteritis.  No acute findings in the chest or pelvis.  Moderate size hiatal hernia with small contained contrast collection at the surgical site in this patient with prior Nissen fundoplication.  Minimal diverticulosis of the colon.  Tiny amount of nonspecific free fluid in the pelvis.   Electronically Signed   By: Elberta Fortis M.D.   On: 02/05/2014 13:08   Dg Chest 2 View  02/01/2014   CLINICAL DATA:  Cough.  EXAM: CHEST  2 VIEW  COMPARISON:  12/12/2013  FINDINGS: Borderline cardiomegaly remains stable. Mild bibasilar scarring is also unchanged. No evidence of pulmonary infiltrate or edema. No evidence of pleural effusion.  IMPRESSION: Stable exam.  No active disease.   Electronically Signed   By: Jonny Ruiz  Eppie GibsonStahl M.D.   On: 02/01/2014 15:40   Ct Head Wo Contrast  02/06/2014   CLINICAL DATA:  Persistent nausea of uncertain etiology, history hypertension  EXAM: CT HEAD WITHOUT CONTRAST  TECHNIQUE: Contiguous axial images were obtained from the base of the skull through  the vertex without intravenous contrast.  COMPARISON:  None.  FINDINGS: There is mild diffuse, confluent, periventricular white matter hypodensity, compatible with chronic small vessel white matter disease.  The gray-white differentiation is preserved.  There is no evidence of an acute infarct.  There is no acute intracranial hemorrhage.  There is no midline shift or mass effect.  There is no extra-axial fluid collection.  There is mild cerebral atrophy.  The paranasal sinuses and mastoid air cells are aerated.  The orbits are intact.  IMPRESSION: No acute intracranial process.   Electronically Signed   By: Fannie KneeKenneth  Crosby   On: 02/06/2014 13:56   Ct Chest Wo Contrast  02/05/2014   CLINICAL DATA:  Upper abdominal pain and chest pain for a few days with nausea and vomiting.  EXAM: CT CHEST, ABDOMEN AND PELVIS WITHOUT CONTRAST  TECHNIQUE: Multidetector CT imaging of the chest, abdomen and pelvis was performed following the standard protocol without IV contrast.  COMPARISON:  None.  FINDINGS: CT CHEST FINDINGS  Lungs are adequately inflated and demonstrate minimal linear scarring/ atelectasis over the posterior bases. Heart is normal in size. Subtle possible calcification over the left anterior descending coronary artery. Minimal calcified plaque involving thoracic aorta. No evidence of hilar or axillary adenopathy. There is a moderate size hiatal hernia. There is a small contrast collection along the inferior aspect of the diaphragmatic hiatus at the level of the hiatal hernia likely related patient's prior Nissen fundoplication.  CT ABDOMEN AND PELVIS FINDINGS  Abdominal images demonstrate evidence of a prior cholecystectomy. There is fatty atrophy of the pancreas. The spleen, liver and adrenal glands are within normal. Kidneys are normal in size, shape and position without evidence of nephrolithiasis. No evidence of obstruction. Ureters are within normal. Note that the mid to distal ureters are difficult to  visualize due to moderate streak artifact from patient's left hip prosthesis as well as barium from recent fluoroscopic evaluation. There is mild diverticulosis of the colon. Appendix is not definitely seen as there is moderate streak artifact over the cecum. There is calcified plaque over the abdominal aorta and iliac arteries. No evidence of bowel obstruction.  Subtle wall thickening involving the 3rd to 4th portion of the duodenum and proximal jejunum with very minimal ill definition of the fat planes adjacent the 3rd to 4th portion of the duodenum as cannot exclude peptic ulcer disease or regional enteritis.  Pelvic images demonstrate a calcified fibroid over the uterine fundus. There is a tiny amount of free fluid over the posterior pelvis. Bladder and rectum are otherwise unremarkable. There are mild degenerative changes of the spine and right hip.  IMPRESSION: Exam is somewhat limited due to streak artifact from retained barium and left hip prosthesis. There is subtle ill definition of the fat planes adjacent the 3rd to 4th portion of the duodenum with minimal wall thickening of this segment as well as proximal jejunum as cannot exclude peptic ulcer disease or regional enteritis.  No acute findings in the chest or pelvis.  Moderate size hiatal hernia with small contained contrast collection at the surgical site in this patient with prior Nissen fundoplication.  Minimal diverticulosis of the colon.  Tiny amount of nonspecific free fluid in the  pelvis.   Electronically Signed   By: Elberta Fortis M.D.   On: 02/05/2014 13:08   Nm Gastric Emptying  02/06/2014   CLINICAL DATA:  Left upper quadrant abdominal pain. Nausea and vomiting.  EXAM: NUCLEAR MEDICINE GASTRIC EMPTYING SCAN  TECHNIQUE: After oral ingestion of radiolabeled meal, sequential abdominal images were obtained for 120 minutes. Residual percentage of activity remaining within the stomach was calculated at 60 and 120 minutes.  RADIOPHARMACEUTICALS:   2 mCi technetium 99-m labeled sulfur colloid  COMPARISON:  Upper GI 05/23/2007 and CT abdomen and pelvis 02/05/2014  FINDINGS: Expected location of the stomach in the left upper quadrant. Ingested meal empties the stomach gradually over the course of the study with 27% retention at 60 min and 30% retention at 120 min (normal retention less than 30% at a 120 min).  IMPRESSION: Gastric emptying within normal limits.   Electronically Signed   By: Sebastian Ache   On: 02/06/2014 12:34   Dg Esophagus  02/04/2014   CLINICAL DATA:  Nausea and vomiting. Previous Nissen fundoplication. Question recurrent hiatal hernia or volvulus on endoscopy.  EXAM: ESOPHOGRAM/BARIUM SWALLOW  TECHNIQUE: Single contrast examination was performed using  thin barium.  FLUOROSCOPY TIME:  1 min 40 seconds  COMPARISON:  None.  FINDINGS: Impression on the distal esophagus is seen consistent with history of previous Nissen fundoplication. A small paraesophageal hernia is seen at this site, which may be due to loosening of the fundoplication wrap.  There is no evidence of esophageal obstruction or mass. There is no evidence of volvulus. Mild dilatation of thoracic esophagus is seen with severe dysmotility including tertiary contractions.  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 No evidence of esophageal mass, obstruction, or volvulus.  Esophageal dysmotility.   Electronically Signed   By: Myles Rosenthal M.D.   On: 02/04/2014 17:08   US Renal  02/06/2014   CLINICAL DATA:  Acute renal failure.  EXAM: RENAL/URINARY TRACT ULTRASOUND COMPLETE  COMPARISON:  CT abdomen and pelvis 02/05/2014 and abdominal ultrasound 05/15/2007  FINDINGS: Right Kidney:  Length: 13.3 cm. Echogenicity within normal limits. No mass or hydronephrosis visualized.  Left Kidney:  Length: 14.3 cm. Echogenicity within normal limits. No mass or hydronephrosis visualized.  Bladder:   Appears normal for degree of bladder distention.  IMPRESSION: No hydronephrosis or other abnormality identified.   Electronically Signed   By: Sebastian Ache   On: 02/06/2014 09:36   Dg Abd 2 Views  02/03/2014   CLINICAL DATA:  Vomiting.  EXAM: ABDOMEN - 2 VIEW  COMPARISON:  Same day.  FINDINGS: The bowel gas pattern is normal. Status post cholecystectomy. There is no evidence of free air. No radio-opaque calculi or other significant radiographic abnormality is seen.  IMPRESSION: Bowel distention noted on prior exam has resolved. There is no evidence of bowel obstruction or ileus.   Electronically Signed   By: Roque Lias M.D.   On: 02/03/2014 10:21   Dg Abd Portable 1v  02/02/2014   CLINICAL DATA:  Abdominal pain.  EXAM: PORTABLE ABDOMEN - 1 VIEW  COMPARISON:  No prior .  FINDINGS: Soft tissue structures are unremarkable. Slightly distended air-filled loops of small large bowel noted. These findings most consistent with adynamic ileus. Surgical clips right upper quadrant consistent with cholecystectomy. Left hip replacement. Pelvic phleboliths. Degenerative changes lumbar spine right hip.  IMPRESSION: Findings most consistent with mild adynamic ileus.   Electronically Signed  ByMaisie Fus  Register   On: 02/02/2014 08:31    Jeoffrey Massed, MD  [facility] Pager:[phone]  If 7PM-7AM, please contact night-coverage [email or web address] Password [email or web address] 02/07/2014, 10:40 AM   LOS: 6 days

## 2014-02-07 NOTE — Progress Notes (Signed)
LOS: 6 days   Subjective: Patient reports she is feeling better today. Nausea improving. Tolerating clears. States she tried to eat some graham crackers and peanut butter over night and "that did not go so well." Is asking this morning if she can have some oatmeal. Loose BM over night. Epigastric discomfort nearly resolved.    Objective: Vital signs in last 24 hours: Temp:  [97.8 F (36.6 C)-98.2 F (36.8 C)] 98.1 F (36.7 C) (11/06 0511) Pulse Rate:  [71-91] 81 (11/06 0511) Resp:  [16-116] 16 (11/06 0511) BP: (155-181)/(64-82) 170/74 mmHg (11/06 0511) SpO2:  [96 %-98 %] 96 % (11/06 0511) Weight:  [98.4 kg (216 lb 14.9 oz)] 98.4 kg (216 lb 14.9 oz) (11/06 0500) Last BM Date: 02/02/14   Laboratory  CBC  Recent Labs  02/06/14 0411 02/07/14 0610  WBC 13.3* 16.7*  HGB 11.0* 11.5*  HCT 34.1* 35.9*  PLT 315 355   BMET  Recent Labs  02/06/14 0411 02/07/14 0610  NA 139 139  K 3.0* 3.0*  CL 99 101  CO2 23 22  GLUCOSE 91 109*  BUN 25* 23  CREATININE 2.83* 2.57*  CALCIUM 7.8* 7.8*     Physical Exam General appearance: alert, cooperative, no distress and moderately obese Throat: moist mucous membranes, no oral lesions Resp: clear to auscultation bilaterally GI: soft, NTND, NABS   Assessment: Recurrent hiatal hernia s/p Nissen Fundoplication (2012 - Dr. Johna SheriffHoxworth for GERD) Inflammatory changes to duodenum/jejunum Epigastric/LUQ abdominal pain, improving Severe nausea, improving. Head CT normal. Gastric emptying study normal. Leukocytosis - now 16.7 ARF - improving, today Cr. 2.57. Renal U/S normal.  Persistent hypokalemia- today K+ 3.0   Plan: Per Dr. Luisa Hartornett, does have small recurrent hiatal hernia and slipped wrap. Appears to be chronic rather than acute Not sure this is cause of nausea. Pt denies pain so unlikely volvulus or ischemia.Can follow up with Dr. Johna SheriffHoxworth as outpatient to discuss wrap revision if symptoms persist. May consider adding Reglan to  antiemetic regimen.    Ardis RowanPRESSON, Kamari Buch LEE, PA, PA-C Pager: [phone] [facility]  02/07/2014

## 2014-02-08 LAB — CBC
HCT: 36.2 % (ref 36.0–46.0)
Hemoglobin: 11.8 g/dL — ABNORMAL LOW (ref 12.0–15.0)
MCH: 28 pg (ref 26.0–34.0)
MCHC: 32.6 g/dL (ref 30.0–36.0)
MCV: 86 fL (ref 78.0–100.0)
Platelets: 379 10*3/uL (ref 150–400)
RBC: 4.21 MIL/uL (ref 3.87–5.11)
RDW: 14.3 % (ref 11.5–15.5)
WBC: 19.1 10*3/uL — ABNORMAL HIGH (ref 4.0–10.5)

## 2014-02-08 LAB — BASIC METABOLIC PANEL
Anion gap: 15 (ref 5–15)
BUN: 20 mg/dL (ref 6–23)
CALCIUM: 7.8 mg/dL — AB (ref 8.4–10.5)
CO2: 23 mEq/L (ref 19–32)
Chloride: 99 mEq/L (ref 96–112)
Creatinine, Ser: 2.35 mg/dL — ABNORMAL HIGH (ref 0.50–1.10)
GFR calc Af Amer: 23 mL/min — ABNORMAL LOW (ref >90)
GFR, EST NON AFRICAN AMERICAN: 20 mL/min — AB (ref >90)
Glucose, Bld: 101 mg/dL — ABNORMAL HIGH (ref 70–99)
POTASSIUM: 3 meq/L — AB (ref 3.7–5.3)
SODIUM: 137 meq/L (ref 137–147)

## 2014-02-08 MED ORDER — CARVEDILOL 25 MG PO TABS
25.0000 mg | ORAL_TABLET | Freq: Two times a day (BID) | ORAL | Status: DC
  Administered 2014-02-08 – 2014-02-09 (×2): 25 mg via ORAL
  Filled 2014-02-08 (×4): qty 1

## 2014-02-08 MED ORDER — POTASSIUM CHLORIDE 10 MEQ/100ML IV SOLN
10.0000 meq | INTRAVENOUS | Status: AC
  Administered 2014-02-08 (×2): 10 meq via INTRAVENOUS
  Filled 2014-02-08 (×2): qty 100

## 2014-02-08 MED ORDER — PANTOPRAZOLE SODIUM 40 MG PO TBEC
40.0000 mg | DELAYED_RELEASE_TABLET | Freq: Two times a day (BID) | ORAL | Status: DC
  Administered 2014-02-08 – 2014-02-11 (×7): 40 mg via ORAL
  Filled 2014-02-08 (×7): qty 1

## 2014-02-08 NOTE — Progress Notes (Signed)
[facility] PROGRESS NOTE Subjective Patient reports that she is tolerating full liquids without difficulty. Everything is going down and she is not having any abdominal pain or dysphagia  Objective: Vital signs in last 24 hours: Temp:  [98 F (36.7 C)-98.8 F (37.1 C)] 98.6 F (37 C) (11/07 0549) Pulse Rate:  [74-85] 82 (11/07 1041) Resp:  [16] 16 (11/06 1335) BP: (148-170)/(70-73) 170/73 mmHg (11/07 1041) SpO2:  [96 %-99 %] 96 % (11/07 0549) Weight:  [96.6 kg (212 lb 15.4 oz)-98.3 kg (216 lb 11.4 oz)] 96.6 kg (212 lb 15.4 oz) (11/07 0602) Last BM Date: 02/07/14  Intake/Output from previous day: 11/06 0701 - 11/07 0700 In: 1538.3 [P.O.:480; I.V.:858.3; IV Piggyback:200] Out: 1850 [Urine:1850] Intake/Output this shift:    PE: General--doing well no acute distress  Abdomen--soft and nontender  Lab Results:  Recent Labs  02/06/14 0411 02/07/14 0610 02/08/14 0449  WBC 13.3* 16.7* 19.1*  HGB 11.0* 11.5* 11.8*  HCT 34.1* 35.9* 36.2  PLT 315 355 379   BMET  Recent Labs  02/06/14 0411 02/07/14 0610 02/08/14 0449  NA 139 139 137  K 3.0* 3.0* 3.0*  CL 99 101 99  CO2 23 22 23   CREATININE 2.83* 2.57* 2.35*   LFT No results for input(s): PROT, AST, ALT, ALKPHOS, BILITOT, BILIDIR, IBILI in the last 72 hours. PT/INR No results for input(s): LABPROT, INR in the last 72 hours. PANCREAS No results for input(s): LIPASE in the last 72 hours.       Studies/Results: Ct Head Wo Contrast  02/06/2014   CLINICAL DATA:  Persistent nausea of uncertain etiology, history hypertension  EXAM: CT HEAD WITHOUT CONTRAST  TECHNIQUE: Contiguous axial images were obtained from the base of the skull through the vertex without intravenous contrast.  COMPARISON:  None.  FINDINGS: There is mild diffuse, confluent, periventricular white matter hypodensity, compatible with chronic small vessel white matter disease.  The gray-white differentiation is preserved.  There is no  evidence of an acute infarct.  There is no acute intracranial hemorrhage.  There is no midline shift or mass effect.  There is no extra-axial fluid collection.  There is mild cerebral atrophy.  The paranasal sinuses and mastoid air cells are aerated.  The orbits are intact.  IMPRESSION: No acute intracranial process.   Electronically Signed   By: Fannie KneeKenneth  Crosby   On: 02/06/2014 13:56   Nm Gastric Emptying  02/06/2014   CLINICAL DATA:  Left upper quadrant abdominal pain. Nausea and vomiting.  EXAM: NUCLEAR MEDICINE GASTRIC EMPTYING SCAN  TECHNIQUE: After oral ingestion of radiolabeled meal, sequential abdominal images were obtained for 120 minutes. Residual percentage of activity remaining within the stomach was calculated at 60 and 120 minutes.  RADIOPHARMACEUTICALS:  2 mCi technetium 99-m labeled sulfur colloid  COMPARISON:  Upper GI 05/23/2007 and CT abdomen and pelvis 02/05/2014  FINDINGS: Expected location of the stomach in the left upper quadrant. Ingested meal empties the stomach gradually over the course of the study with 27% retention at 60 min and 30% retention at 120 min (normal retention less than 30% at a 120 min).  IMPRESSION: Gastric emptying within normal limits.   Electronically Signed   By: Sebastian AcheAllen  Grady   On: 02/06/2014 12:34    Medications: I have reviewed the patient's current medications.  Assessment/Plan: 1. Nausea and vomiting/epigastric - chest pain. Appears to be much better with Reglan. Probably has some component of hiatal hernia but currently tolerating full liquid diet. I think we can go ahead  and advance her to low residue diet and see how she does.   Rylon Poitra JR,Lajuanda Penick L 02/08/2014, 10:51 AM

## 2014-02-08 NOTE — Progress Notes (Signed)
[facility] Progress Note  4 Days Post-Op  Subjective: Pt says she's much better today.  No more N/V, NO abdominal pain, denies chest pain or SOB.  Mobilizing some OOB.  Scared to eat, but was able to eat some grits this morning.  Hungry/thirsty.  WBC continues to rise, but renal failure improving.  Objective: Vital signs in last 24 hours: Temp:  [98 F (36.7 C)-98.8 F (37.1 C)] 98.6 F (37 C) (11/07 0549) Pulse Rate:  [74-85] 82 (11/07 0549) Resp:  [16] 16 (11/06 1335) BP: (148-170)/(70-73) 170/73 mmHg (11/07 0549) SpO2:  [96 %-99 %] 96 % (11/07 0549) Weight:  [212 lb 15.4 oz (96.6 kg)-216 lb 11.4 oz (98.3 kg)] 212 lb 15.4 oz (96.6 kg) (11/07 0602) Last BM Date: 02/07/14  Intake/Output from previous day: 11/06 0701 - 11/07 0700 In: 1538.3 [P.O.:480; I.V.:858.3; IV Piggyback:200] Out: 1850 [Urine:1850] Intake/Output this shift:    PE: Gen:  Alert, NAD, pleasant Card:  RRR, no M/G/R heard Pulm:  CTA, no W/R/R, good effort Abd: Soft, NT/ND, +BS, no HSM, abdominal scars noted   Lab Results:   Recent Labs  02/07/14 0610 02/08/14 0449  WBC 16.7* 19.1*  HGB 11.5* 11.8*  HCT 35.9* 36.2  PLT 355 379   BMET  Recent Labs  02/07/14 0610 02/08/14 0449  NA 139 137  K 3.0* 3.0*  CL 101 99  CO2 22 23  GLUCOSE 109* 101*  BUN 23 20  CREATININE 2.57* 2.35*  CALCIUM 7.8* 7.8*   PT/INR No results for input(s): LABPROT, INR in the last 72 hours. CMP     Component Value Date/Time   NA 137 02/08/2014 0449   K 3.0* 02/08/2014 0449   CL 99 02/08/2014 0449   CO2 23 02/08/2014 0449   GLUCOSE 101* 02/08/2014 0449   BUN 20 02/08/2014 0449   CREATININE 2.35* 02/08/2014 0449   CALCIUM 7.8* 02/08/2014 0449   PROT 6.6 02/05/2014 0859   ALBUMIN 3.1* 02/05/2014 0859   AST 26 02/05/2014 0859   ALT 26 02/05/2014 0859   ALKPHOS 116 02/05/2014 0859   BILITOT 0.3 02/05/2014 0859   GFRNONAA 20* 02/08/2014 0449   GFRAA 23* 02/08/2014 0449   Lipase     Component  Value Date/Time   LIPASE 22 02/05/2014 0859       Studies/Results: Ct Head Wo Contrast  02/06/2014   CLINICAL DATA:  Persistent nausea of uncertain etiology, history hypertension  EXAM: CT HEAD WITHOUT CONTRAST  TECHNIQUE: Contiguous axial images were obtained from the base of the skull through the vertex without intravenous contrast.  COMPARISON:  None.  FINDINGS: There is mild diffuse, confluent, periventricular white matter hypodensity, compatible with chronic small vessel white matter disease.  The gray-white differentiation is preserved.  There is no evidence of an acute infarct.  There is no acute intracranial hemorrhage.  There is no midline shift or mass effect.  There is no extra-axial fluid collection.  There is mild cerebral atrophy.  The paranasal sinuses and mastoid air cells are aerated.  The orbits are intact.  IMPRESSION: No acute intracranial process.   Electronically Signed   By: Fannie KneeKenneth  Crosby   On: 02/06/2014 13:56   Nm Gastric Emptying  02/06/2014   CLINICAL DATA:  Left upper quadrant abdominal pain. Nausea and vomiting.  EXAM: NUCLEAR MEDICINE GASTRIC EMPTYING SCAN  TECHNIQUE: After oral ingestion of radiolabeled meal, sequential abdominal images were obtained for 120 minutes. Residual percentage of activity remaining within the stomach was calculated at  60 and 120 minutes.  RADIOPHARMACEUTICALS:  2 mCi technetium 99-m labeled sulfur colloid  COMPARISON:  Upper GI 05/23/2007 and CT abdomen and pelvis 02/05/2014  FINDINGS: Expected location of the stomach in the left upper quadrant. Ingested meal empties the stomach gradually over the course of the study with 27% retention at 60 min and 30% retention at 120 min (normal retention less than 30% at a 120 min).  IMPRESSION: Gastric emptying within normal limits.   Electronically Signed   By: Sebastian AcheAllen  Grady   On: 02/06/2014 12:34    Anti-infectives: Anti-infectives    Start     Dose/Rate Route Frequency Ordered Stop   02/02/14 2100   piperacillin-tazobactam (ZOSYN) IVPB 3.375 g  Status:  Discontinued     3.375 g12.5 mL/hr over 240 Minutes Intravenous 3 times per day 02/02/14 1747 02/03/14 1034   02/02/14 0800  vancomycin (VANCOCIN) IVPB 1000 mg/200 mL premix  Status:  Discontinued     1,000 mg200 mL/hr over 60 Minutes Intravenous Every 12 hours 02/01/14 2336 02/04/14 1229   02/01/14 2359  piperacillin-tazobactam (ZOSYN) IVPB 3.375 g  Status:  Discontinued     3.375 g12.5 mL/hr over 240 Minutes Intravenous 3 times per day 02/01/14 2336 02/02/14 1747   02/01/14 2345  vancomycin (VANCOCIN) 1,500 mg in sodium chloride 0.9 % 500 mL IVPB     1,500 mg250 mL/hr over 120 Minutes Intravenous  Once 02/01/14 2336 02/02/14 0145   02/01/14 2330  piperacillin-tazobactam (ZOSYN) IVPB 3.375 g  Status:  Discontinued     3.375 g100 mL/hr over 30 Minutes Intravenous  Once 02/01/14 2328 02/01/14 2332   02/01/14 2330  vancomycin (VANCOCIN) IVPB 1000 mg/200 mL premix  Status:  Discontinued     1,000 mg200 mL/hr over 60 Minutes Intravenous  Once 02/01/14 2328 02/01/14 2332   02/01/14 1700  clindamycin (CLEOCIN) IVPB 600 mg     600 mg100 mL/hr over 30 Minutes Intravenous  Once 02/01/14 1658 02/01/14 1757       Assessment/Plan Recurrent hiatal hernia s/p Nissen Fundoplication (2012 - Dr. Johna SheriffHoxworth for GERD) Inflammatory changes to duodenum/jejunum Epigastric/LUQ abdominal pain - resolved Severe nausea - improving. Head CT normal. Gastric emptying study normal. Leukocytosis - 19.1 today ARF - improving, today Cr. 2.35. Renal U/S normal.  Persistent hypokalemia - today K+ 3.0  Plan: 1.  Per Dr. Luisa Hartornett, does have small recurrent hiatal hernia and slipped wrap.  2.  Appears to be chronic rather than acute, not convinced this is cause of nausea.  We would expect to see severe chest pain if she had volvulus or ischemia, but pt denies pain. 3.  Can follow up with Dr. Johna SheriffHoxworth as outpatient to discuss wrap revision if symptoms persist.  4.   Continue Reglan 5mg  with meals and before bed time as this seems to be helping significantly    LOS: 7 days    DORT, Saket Hellstrom 02/08/2014, 9:51 AM Pager: [phone]

## 2014-02-08 NOTE — Progress Notes (Signed)
PATIENT DETAILS Name: Sheri Mccoy Age: 73 y.o. Sex: female Date of Birth: 1941/03/03 Admit Date: 02/01/2014 Admitting Physician Alison Murray, MD ZOX:[id number] Molly Maduro, MD  Brief Summary: 73 yo female with hx of HTN, Dyslipidemia, recent Right Knee Arthroplasty, recent UTI-admitted with several days hx of nausea-"dry heaving". She was found to have mild ileus of Abd Xray, mild cellulitis of her Right lower extremity. She was admitted for supportive care, IV Abx. Unfortunately, with patient having BM's and inspite of radiologic improvement no improvement in her symptoms. GI was subsequently consulted and underwent EGD/CT Abd/chest and Gastric emptying study-all negative (findings below). Now has new ARF and leukocytosis.  Subjective: Much improved. Easily tolerating full liquids. No abdominal pain  Assessment/Plan: Principal Problem: Nausea/Vomiting:initially felt to be secondary to mild Ileus,however symptoms continuing in spite of radiological improvement and numerous bowel movements.Since she continued to have symptoms, GI consulted, EGD done on 11/3 which showed recurrence of her hiatal hernia.She then underwent CT Scan of abd/chest which was negative for major abnormaliteis. Gastric emptying study was negative. She was managed with supportive care. Now showing signs of improvement on IV Reglan, diet being slowly advanced. No further nausea/upper abdominal pain. Etiology of nausea/upper abdominal pain not clear.  Active Problems:   Leukocytosis: ?stress margination-no site of infection apparent-her leg is at baseline (has chronic skin changes. UA not really suggestive of UTI, Urine cs positive for 20 K colonies of multiple bacterial morphotypes. Blood culture 11/1 neg so far. Plans are to continue  monitor off antibiotics and panculture if febrile.Repeat blood culture on 11/6 pending. Patient afebrile, non toxic looking and clinically improved    Minimal Right Leg  Cellulitis:significantly improved, per patient-she had chronic mild erythema, leg back to baseline. Empirically started on both vancomycin and Zosyn on admission, Zosyn discontinued on 11/2, since hardly any erythema  Vancomycin stopped  on 11/3.Leg continues to remains stable this am.Dopper RLE neg for DVT   JYN:WGNFAOZ secondary to pre-renal process. Recent UA neg for proteinuria.Renal Ultrasound neg for hydronephroses. Strict I&O's, creatinine now slowly downtrending, euvolemic on exam, have decreased rate of  IVF to 75 cc/hr-as diet being advanced.    Hyponatremia:resolved, secondary to above    Hypokalemia: secondary to above. Will replete and recheck in am.Add K to IVF      HTN (hypertension):better, continue with Amlodipine,  Coreg-will follow and titrate accordingly, add prn Hydralazine.Micardis/HCTZ remains on hold.    HLD (hyperlipidemia):will hold statins for now given worsening labs  Disposition: Remain inpatient  Antibiotics:  IV Vancomycin 10/31>>11/4  IV Zosyn 10/31>>11/2  DVT Prophylaxis: Prophylactic Lovenox   Code Status: Full code   Family Communication Son-Chris tel-[phone]-left message on 11/6  Procedures:  None  CONSULTS:  None  MEDICATIONS: Scheduled Meds: . amLODipine  10 mg Oral Daily  . atorvastatin  40 mg Oral Daily  . carvedilol  12.5 mg Oral BID WC  . enoxaparin (LOVENOX) injection  30 mg Subcutaneous Daily  . metoCLOPramide (REGLAN) injection  5 mg Intravenous TID AC & HS  . pantoprazole  40 mg Oral BID  . pneumococcal 23 valent vaccine  0.5 mL Intramuscular Tomorrow-1000  . potassium chloride  10 mEq Intravenous Q1 Hr x 2  . potassium chloride  40 mEq Oral Once  . senna-docusate  2 tablet Oral BID   Continuous Infusions: . sodium chloride 0.9 % 1,000 mL with potassium chloride 20 mEq infusion 100 mL/hr at 02/08/14 0116   PRN Meds:.acetaminophen, hydrALAZINE,  morphine injection, [DISCONTINUED] ondansetron **OR** ondansetron  (ZOFRAN) IV, phenol, prochlorperazine  Antibiotics: Anti-infectives    Start     Dose/Rate Route Frequency Ordered Stop   02/02/14 2100  piperacillin-tazobactam (ZOSYN) IVPB 3.375 g  Status:  Discontinued     3.375 g12.5 mL/hr over 240 Minutes Intravenous 3 times per day 02/02/14 1747 02/03/14 1034   02/02/14 0800  vancomycin (VANCOCIN) IVPB 1000 mg/200 mL premix  Status:  Discontinued     1,000 mg200 mL/hr over 60 Minutes Intravenous Every 12 hours 02/01/14 2336 02/04/14 1229   02/01/14 2359  piperacillin-tazobactam (ZOSYN) IVPB 3.375 g  Status:  Discontinued     3.375 g12.5 mL/hr over 240 Minutes Intravenous 3 times per day 02/01/14 2336 02/02/14 1747   02/01/14 2345  vancomycin (VANCOCIN) 1,500 mg in sodium chloride 0.9 % 500 mL IVPB     1,500 mg250 mL/hr over 120 Minutes Intravenous  Once 02/01/14 2336 02/02/14 0145   02/01/14 2330  piperacillin-tazobactam (ZOSYN) IVPB 3.375 g  Status:  Discontinued     3.375 g100 mL/hr over 30 Minutes Intravenous  Once 02/01/14 2328 02/01/14 2332   02/01/14 2330  vancomycin (VANCOCIN) IVPB 1000 mg/200 mL premix  Status:  Discontinued     1,000 mg200 mL/hr over 60 Minutes Intravenous  Once 02/01/14 2328 02/01/14 2332   02/01/14 1700  clindamycin (CLEOCIN) IVPB 600 mg     600 mg100 mL/hr over 30 Minutes Intravenous  Once 02/01/14 1658 02/01/14 1757       PHYSICAL EXAM: Vital signs in last 24 hours: Filed Vitals:   02/07/14 2036 02/08/14 0549 02/08/14 0602 02/08/14 1041  BP: 159/70 170/73  170/73  Pulse: 74 82  82  Temp: 98.8 F (37.1 C) 98.6 F (37 C)    TempSrc: Oral Oral    Resp:      Height:      Weight:   96.6 kg (212 lb 15.4 oz)   SpO2: 99% 96%      Weight change: -0.1 kg (-3.5 oz) Filed Weights   02/07/14 0500 02/07/14 1335 02/08/14 0602  Weight: 98.4 kg (216 lb 14.9 oz) 98.3 kg (216 lb 11.4 oz) 96.6 kg (212 lb 15.4 oz)   Body mass index is 36.54 kg/(m^2).   Gen Exam: Awake and alert with clear speech.Not in any distress Neck:  Supple, No JVD.   Chest: B/L Clear.  No rales or rhonchi CVS: S1 S2 Regular, no murmurs.  Abdomen: soft, BS +, non tender epigastric area, non distended.  Extremities: no edema, lower extremities warm to touch.Mild stable chronic erythema to right leg. Neurologic: Non Focal.   Skin: No Rash.   Wounds: N/A.    Intake/Output from previous day:  Intake/Output Summary (Last 24 hours) at 02/08/14 1114 Last data filed at 02/08/14 0038  Gross per 24 hour  Intake 1298.33 ml  Output   1450 ml  Net -151.67 ml     LAB RESULTS: CBC  Recent Labs Lab 02/01/14 1455  02/03/14 0540 02/05/14 0859 02/06/14 0411 02/07/14 0610 02/08/14 0449  WBC 9.9  < > 8.0 15.3* 13.3* 16.7* 19.1*  HGB 11.6*  < > 10.9* 12.8 11.0* 11.5* 11.8*  HCT 35.9*  < > 34.3* 39.4 34.1* 35.9* 36.2  PLT 274  < > 274 368 315 355 379  MCV 87.6  < > 88.6 87.0 87.0 88.4 86.0  MCH 28.3  < > 28.2 28.3 28.1 28.3 28.0  MCHC 32.3  < > 31.8 32.5 32.3 32.0 32.6  RDW  14.1  < > 14.3 14.3 14.4 14.2 14.3  LYMPHSABS 0.4*  --   --   --   --   --   --   MONOABS 0.2  --   --   --   --   --   --   EOSABS 0.0  --   --   --   --   --   --   BASOSABS 0.0  --   --   --   --   --   --   < > = values in this interval not displayed.  Chemistries   Recent Labs Lab 02/03/14 0540  02/05/14 0553 02/05/14 0859 02/06/14 0411 02/07/14 0610 02/08/14 0449  NA 142  < > 140 139 139 139 137  K 3.6*  < > 3.0* 2.9* 3.0* 3.0* 3.0*  CL 103  < > 99 97 99 101 99  CO2 26  < > 24 24 23 22 23   GLUCOSE 110*  < > 116* 122* 91 109* 101*  BUN 20  < > 22 22 25* 23 20  CREATININE 0.91  < > 2.54* 2.55* 2.83* 2.57* 2.35*  CALCIUM 8.3*  < > 8.4 8.6 7.8* 7.8* 7.8*  MG 2.4  --   --   --   --   --   --   < > = values in this interval not displayed.  CBG: No results for input(s): GLUCAP in the last 168 hours.  GFR Estimated Creatinine Clearance: 24.4 mL/min (by C-G formula based on Cr of 2.35).  Coagulation profile No results for input(s): INR, PROTIME  in the last 168 hours.  Cardiac Enzymes  Recent Labs Lab 02/05/14 0859  TROPONINI <0.30    Invalid input(s): POCBNP No results for input(s): DDIMER in the last 72 hours. No results for input(s): HGBA1C in the last 72 hours. No results for input(s): CHOL, HDL, LDLCALC, TRIG, CHOLHDL, LDLDIRECT in the last 72 hours. No results for input(s): TSH, T4TOTAL, T3FREE, THYROIDAB in the last 72 hours.  Invalid input(s): FREET3 No results for input(s): VITAMINB12, FOLATE, FERRITIN, TIBC, IRON, RETICCTPCT in the last 72 hours. No results for input(s): LIPASE, AMYLASE in the last 72 hours.  Urine Studies No results for input(s): UHGB, CRYS in the last 72 hours.  Invalid input(s): UACOL, UAPR, USPG, UPH, UTP, UGL, UKET, UBIL, UNIT, UROB, ULEU, UEPI, UWBC, URBC, UBAC, CAST, UCOM, BILUA  MICROBIOLOGY: Recent Results (from the past 240 hour(s))  Urine culture     Status: None   Collection Time: 02/01/14  2:56 PM  Result Value Ref Range Status   Specimen Description URINE, CLEAN CATCH  Final   Special Requests NONE  Final   Culture  Setup Time   Final    02/01/2014 23:54 Performed at [facility]    Colony Count   Final    20,OOO COLONIES/ML Performed at [facility]    Culture   Final    Multiple bacterial morphotypes present, none predominant. Suggest appropriate recollection if clinically indicated. Performed at [facility]    Report Status 02/02/2014 FINAL  Final  Culture, blood (routine x 2)     Status: None (Preliminary result)   Collection Time: 02/02/14 12:43 AM  Result Value Ref Range Status   Specimen Description BLOOD RIGHT HAND  Final   Special Requests BOTTLES DRAWN AEROBIC AND ANAEROBIC 4CC EA  Final   Culture  Setup Time   Final    02/02/2014 18:00 Performed  at [facility]   Final           BLOOD CULTURE RECEIVED NO GROWTH TO DATE CULTURE WILL BE HELD FOR 5 DAYS BEFORE ISSUING A FINAL NEGATIVE REPORT Performed at  [facility]    Report Status PENDING  Incomplete  Culture, blood (routine x 2)     Status: None (Preliminary result)   Collection Time: 02/02/14 12:48 AM  Result Value Ref Range Status   Specimen Description BLOOD LEFT HAND  Final   Special Requests BOTTLES DRAWN AEROBIC AND ANAEROBIC 5CC EA  Final   Culture  Setup Time   Final    02/02/2014 18:00 Performed at [facility]    Culture   Final           BLOOD CULTURE RECEIVED NO GROWTH TO DATE CULTURE WILL BE HELD FOR 5 DAYS BEFORE ISSUING A FINAL NEGATIVE REPORT Performed at [facility]    Report Status PENDING  Incomplete    RADIOLOGY STUDIES/RESULTS: Ct Abdomen Pelvis Wo Contrast  02/05/2014   CLINICAL DATA:  Upper abdominal pain and chest pain for a few days with nausea and vomiting.  EXAM: CT CHEST, ABDOMEN AND PELVIS WITHOUT CONTRAST  TECHNIQUE: Multidetector CT imaging of the chest, abdomen and pelvis was performed following the standard protocol without IV contrast.  COMPARISON:  None.  FINDINGS: CT CHEST FINDINGS  Lungs are adequately inflated and demonstrate minimal linear scarring/ atelectasis over the posterior bases. Heart is normal in size. Subtle possible calcification over the left anterior descending coronary artery. Minimal calcified plaque involving thoracic aorta. No evidence of hilar or axillary adenopathy. There is a moderate size hiatal hernia. There is a small contrast collection along the inferior aspect of the diaphragmatic hiatus at the level of the hiatal hernia likely related patient's prior Nissen fundoplication.  CT ABDOMEN AND PELVIS FINDINGS  Abdominal images demonstrate evidence of a prior cholecystectomy. There is fatty atrophy of the pancreas. The spleen, liver and adrenal glands are within normal. Kidneys are normal in size, shape and position without evidence of nephrolithiasis. No evidence of obstruction. Ureters are within normal. Note that the mid to distal ureters are difficult to  visualize due to moderate streak artifact from patient's left hip prosthesis as well as barium from recent fluoroscopic evaluation. There is mild diverticulosis of the colon. Appendix is not definitely seen as there is moderate streak artifact over the cecum. There is calcified plaque over the abdominal aorta and iliac arteries. No evidence of bowel obstruction.  Subtle wall thickening involving the 3rd to 4th portion of the duodenum and proximal jejunum with very minimal ill definition of the fat planes adjacent the 3rd to 4th portion of the duodenum as cannot exclude peptic ulcer disease or regional enteritis.  Pelvic images demonstrate a calcified fibroid over the uterine fundus. There is a tiny amount of free fluid over the posterior pelvis. Bladder and rectum are otherwise unremarkable. There are mild degenerative changes of the spine and right hip.  IMPRESSION: Exam is somewhat limited due to streak artifact from retained barium and left hip prosthesis. There is subtle ill definition of the fat planes adjacent the 3rd to 4th portion of the duodenum with minimal wall thickening of this segment as well as proximal jejunum as cannot exclude peptic ulcer disease or regional enteritis.  No acute findings in the chest or pelvis.  Moderate size hiatal hernia with small contained contrast collection at the surgical site in  this patient with prior Nissen fundoplication.  Minimal diverticulosis of the colon.  Tiny amount of nonspecific free fluid in the pelvis.   Electronically Signed   By: Elberta Fortisaniel  Boyle M.D.   On: 02/05/2014 13:08   Dg Chest 2 View  02/01/2014   CLINICAL DATA:  Cough.  EXAM: CHEST  2 VIEW  COMPARISON:  12/12/2013  FINDINGS: Borderline cardiomegaly remains stable. Mild bibasilar scarring is also unchanged. No evidence of pulmonary infiltrate or edema. No evidence of pleural effusion.  IMPRESSION: Stable exam.  No active disease.   Electronically Signed   By: Myles RosenthalJohn  Stahl M.D.   On: 02/01/2014 15:40     Ct Head Wo Contrast  02/06/2014   CLINICAL DATA:  Persistent nausea of uncertain etiology, history hypertension  EXAM: CT HEAD WITHOUT CONTRAST  TECHNIQUE: Contiguous axial images were obtained from the base of the skull through the vertex without intravenous contrast.  COMPARISON:  None.  FINDINGS: There is mild diffuse, confluent, periventricular white matter hypodensity, compatible with chronic small vessel white matter disease.  The gray-white differentiation is preserved.  There is no evidence of an acute infarct.  There is no acute intracranial hemorrhage.  There is no midline shift or mass effect.  There is no extra-axial fluid collection.  There is mild cerebral atrophy.  The paranasal sinuses and mastoid air cells are aerated.  The orbits are intact.  IMPRESSION: No acute intracranial process.   Electronically Signed   By: Fannie KneeKenneth  Crosby   On: 02/06/2014 13:56   Ct Chest Wo Contrast  02/05/2014   CLINICAL DATA:  Upper abdominal pain and chest pain for a few days with nausea and vomiting.  EXAM: CT CHEST, ABDOMEN AND PELVIS WITHOUT CONTRAST  TECHNIQUE: Multidetector CT imaging of the chest, abdomen and pelvis was performed following the standard protocol without IV contrast.  COMPARISON:  None.  FINDINGS: CT CHEST FINDINGS  Lungs are adequately inflated and demonstrate minimal linear scarring/ atelectasis over the posterior bases. Heart is normal in size. Subtle possible calcification over the left anterior descending coronary artery. Minimal calcified plaque involving thoracic aorta. No evidence of hilar or axillary adenopathy. There is a moderate size hiatal hernia. There is a small contrast collection along the inferior aspect of the diaphragmatic hiatus at the level of the hiatal hernia likely related patient's prior Nissen fundoplication.  CT ABDOMEN AND PELVIS FINDINGS  Abdominal images demonstrate evidence of a prior cholecystectomy. There is fatty atrophy of the pancreas. The spleen, liver  and adrenal glands are within normal. Kidneys are normal in size, shape and position without evidence of nephrolithiasis. No evidence of obstruction. Ureters are within normal. Note that the mid to distal ureters are difficult to visualize due to moderate streak artifact from patient's left hip prosthesis as well as barium from recent fluoroscopic evaluation. There is mild diverticulosis of the colon. Appendix is not definitely seen as there is moderate streak artifact over the cecum. There is calcified plaque over the abdominal aorta and iliac arteries. No evidence of bowel obstruction.  Subtle wall thickening involving the 3rd to 4th portion of the duodenum and proximal jejunum with very minimal ill definition of the fat planes adjacent the 3rd to 4th portion of the duodenum as cannot exclude peptic ulcer disease or regional enteritis.  Pelvic images demonstrate a calcified fibroid over the uterine fundus. There is a tiny amount of free fluid over the posterior pelvis. Bladder and rectum are otherwise unremarkable. There are mild degenerative changes of the spine  and right hip.  IMPRESSION: Exam is somewhat limited due to streak artifact from retained barium and left hip prosthesis. There is subtle ill definition of the fat planes adjacent the 3rd to 4th portion of the duodenum with minimal wall thickening of this segment as well as proximal jejunum as cannot exclude peptic ulcer disease or regional enteritis.  No acute findings in the chest or pelvis.  Moderate size hiatal hernia with small contained contrast collection at the surgical site in this patient with prior Nissen fundoplication.  Minimal diverticulosis of the colon.  Tiny amount of nonspecific free fluid in the pelvis.   Electronically Signed   By: Elberta Fortis M.D.   On: 02/05/2014 13:08   Nm Gastric Emptying  02/06/2014   CLINICAL DATA:  Left upper quadrant abdominal pain. Nausea and vomiting.  EXAM: NUCLEAR MEDICINE GASTRIC EMPTYING SCAN   TECHNIQUE: After oral ingestion of radiolabeled meal, sequential abdominal images were obtained for 120 minutes. Residual percentage of activity remaining within the stomach was calculated at 60 and 120 minutes.  RADIOPHARMACEUTICALS:  2 mCi technetium 99-m labeled sulfur colloid  COMPARISON:  Upper GI 05/23/2007 and CT abdomen and pelvis 02/05/2014  FINDINGS: Expected location of the stomach in the left upper quadrant. Ingested meal empties the stomach gradually over the course of the study with 27% retention at 60 min and 30% retention at 120 min (normal retention less than 30% at a 120 min).  IMPRESSION: Gastric emptying within normal limits.   Electronically Signed   By: Sebastian Ache   On: 02/06/2014 12:34   Dg Esophagus  02/04/2014   CLINICAL DATA:  Nausea and vomiting. Previous Nissen fundoplication. Question recurrent hiatal hernia or volvulus on endoscopy.  EXAM: ESOPHOGRAM/BARIUM SWALLOW  TECHNIQUE: Single contrast examination was performed using  thin barium.  FLUOROSCOPY TIME:  1 min 40 seconds  COMPARISON:  None.  FINDINGS: Impression on the distal esophagus is seen consistent with history of previous Nissen fundoplication. A small paraesophageal hernia is seen at this site, which may be due to loosening of the fundoplication wrap.  There is no evidence of esophageal obstruction or mass. There is no evidence of volvulus. Mild dilatation of thoracic esophagus is seen with severe dysmotility including tertiary contractions.  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 No evidence of esophageal mass, obstruction, or volvulus.  Esophageal dysmotility.   Electronically Signed   By: Myles Rosenthal M.D.   On: 02/04/2014 17:08   US Renal  02/06/2014   CLINICAL DATA:  Acute renal failure.  EXAM: RENAL/URINARY TRACT ULTRASOUND COMPLETE  COMPARISON:  CT abdomen and pelvis 02/05/2014 and abdominal ultrasound  05/15/2007  FINDINGS: Right Kidney:  Length: 13.3 cm. Echogenicity within normal limits. No mass or hydronephrosis visualized.  Left Kidney:  Length: 14.3 cm. Echogenicity within normal limits. No mass or hydronephrosis visualized.  Bladder:  Appears normal for degree of bladder distention.  IMPRESSION: No hydronephrosis or other abnormality identified.   Electronically Signed   By: Sebastian Ache   On: 02/06/2014 09:36   Dg Abd 2 Views  02/03/2014   CLINICAL DATA:  Vomiting.  EXAM: ABDOMEN - 2 VIEW  COMPARISON:  Same day.  FINDINGS: The bowel gas pattern is normal. Status post cholecystectomy. There is no evidence of free air. No radio-opaque calculi or other significant radiographic abnormality is seen.  IMPRESSION: Bowel distention noted on prior exam has resolved. There is no evidence of  bowel obstruction or ileus.   Electronically Signed   By: Roque Lias M.D.   On: 02/03/2014 10:21   Dg Abd Portable 1v  02/02/2014   CLINICAL DATA:  Abdominal pain.  EXAM: PORTABLE ABDOMEN - 1 VIEW  COMPARISON:  No prior .  FINDINGS: Soft tissue structures are unremarkable. Slightly distended air-filled loops of small large bowel noted. These findings most consistent with adynamic ileus. Surgical clips right upper quadrant consistent with cholecystectomy. Left hip replacement. Pelvic phleboliths. Degenerative changes lumbar spine right hip.  IMPRESSION: Findings most consistent with mild adynamic ileus.   Electronically Signed   By: Maisie Fus  Register   On: 02/02/2014 08:31    Jeoffrey Massed, MD  [facility] Pager:[phone]  If 7PM-7AM, please contact night-coverage [email or web address] Password [email or web address] 02/08/2014, 11:14 AM   LOS: 7 days

## 2014-02-08 NOTE — Progress Notes (Signed)
Ambulated to well nsg station and back with staff tol well

## 2014-02-09 LAB — BASIC METABOLIC PANEL
Anion gap: 16 — ABNORMAL HIGH (ref 5–15)
BUN: 20 mg/dL (ref 6–23)
CO2: 21 meq/L (ref 19–32)
Calcium: 7.5 mg/dL — ABNORMAL LOW (ref 8.4–10.5)
Chloride: 100 mEq/L (ref 96–112)
Creatinine, Ser: 2.2 mg/dL — ABNORMAL HIGH (ref 0.50–1.10)
GFR calc Af Amer: 25 mL/min — ABNORMAL LOW (ref >90)
GFR calc non Af Amer: 21 mL/min — ABNORMAL LOW (ref >90)
GLUCOSE: 98 mg/dL (ref 70–99)
Potassium: 3.3 mEq/L — ABNORMAL LOW (ref 3.7–5.3)
SODIUM: 137 meq/L (ref 137–147)

## 2014-02-09 LAB — CBC WITH DIFFERENTIAL/PLATELET
Basophils Absolute: 0 10*3/uL (ref 0.0–0.1)
Basophils Relative: 0 % (ref 0–1)
Eosinophils Absolute: 0.1 10*3/uL (ref 0.0–0.7)
Eosinophils Relative: 1 % (ref 0–5)
HCT: 33.6 % — ABNORMAL LOW (ref 36.0–46.0)
Hemoglobin: 10.9 g/dL — ABNORMAL LOW (ref 12.0–15.0)
Lymphocytes Relative: 12 % (ref 12–46)
Lymphs Abs: 2.1 10*3/uL (ref 0.7–4.0)
MCH: 28.7 pg (ref 26.0–34.0)
MCHC: 32.4 g/dL (ref 30.0–36.0)
MCV: 88.4 fL (ref 78.0–100.0)
Monocytes Absolute: 1.5 10*3/uL — ABNORMAL HIGH (ref 0.1–1.0)
Monocytes Relative: 9 % (ref 3–12)
Neutro Abs: 13.7 10*3/uL — ABNORMAL HIGH (ref 1.7–7.7)
Neutrophils Relative %: 78 % — ABNORMAL HIGH (ref 43–77)
Platelets: 352 10*3/uL (ref 150–400)
RBC: 3.8 MIL/uL — ABNORMAL LOW (ref 3.87–5.11)
RDW: 14.5 % (ref 11.5–15.5)
WBC: 17.5 10*3/uL — ABNORMAL HIGH (ref 4.0–10.5)

## 2014-02-09 MED ORDER — POTASSIUM CHLORIDE 10 MEQ/100ML IV SOLN
10.0000 meq | INTRAVENOUS | Status: AC
  Administered 2014-02-09 (×2): 10 meq via INTRAVENOUS
  Filled 2014-02-09 (×2): qty 100

## 2014-02-09 MED ORDER — CARVEDILOL 12.5 MG PO TABS
12.5000 mg | ORAL_TABLET | Freq: Two times a day (BID) | ORAL | Status: DC
  Administered 2014-02-09 – 2014-02-11 (×4): 12.5 mg via ORAL
  Filled 2014-02-09 (×6): qty 1

## 2014-02-09 NOTE — Progress Notes (Signed)
[facility] Progress Note  5 Days Post-Op  Subjective: Pt feels much better.  She complains of frequent urination.  She says her nausea is resolved with the meds she's on and she's tolerating a D1 diet.  Absolutely no abdominal or chest pain.  WBC and Creatinine down today.   Objective: Vital signs in last 24 hours: Temp:  [98.4 F (36.9 C)-98.6 F (37 C)] 98.5 F (36.9 C) (11/08 0521) Pulse Rate:  [73-82] 81 (11/08 0800) Resp:  [16-18] 18 (11/08 0521) BP: (129-170)/(63-73) 153/69 mmHg (11/08 0800) SpO2:  [98 %-99 %] 98 % (11/08 0521) Weight:  [215 lb 6.2 oz (97.7 kg)] 215 lb 6.2 oz (97.7 kg) (11/08 0521) Last BM Date: 02/07/14  Intake/Output from previous day: 11/07 0701 - 11/08 0700 In: 1840 [P.O.:690; I.V.:950; IV Piggyback:200] Out: -  Intake/Output this shift:    PE: Gen:  Alert, NAD, pleasant Abd: Soft, NT/ND, +BS, no HSM, no abdominal scars noted   Lab Results:   Recent Labs  02/08/14 0449 02/09/14 0500  WBC 19.1* 17.5*  HGB 11.8* 10.9*  HCT 36.2 33.6*  PLT 379 352   BMET  Recent Labs  02/08/14 0449 02/09/14 0500  NA 137 137  K 3.0* 3.3*  CL 99 100  CO2 23 21  GLUCOSE 101* 98  BUN 20 20  CREATININE 2.35* 2.20*  CALCIUM 7.8* 7.5*   PT/INR No results for input(s): LABPROT, INR in the last 72 hours. CMP     Component Value Date/Time   NA 137 02/09/2014 0500   K 3.3* 02/09/2014 0500   CL 100 02/09/2014 0500   CO2 21 02/09/2014 0500   GLUCOSE 98 02/09/2014 0500   BUN 20 02/09/2014 0500   CREATININE 2.20* 02/09/2014 0500   CALCIUM 7.5* 02/09/2014 0500   PROT 6.6 02/05/2014 0859   ALBUMIN 3.1* 02/05/2014 0859   AST 26 02/05/2014 0859   ALT 26 02/05/2014 0859   ALKPHOS 116 02/05/2014 0859   BILITOT 0.3 02/05/2014 0859   GFRNONAA 21* 02/09/2014 0500   GFRAA 25* 02/09/2014 0500   Lipase     Component Value Date/Time   LIPASE 22 02/05/2014 0859       Studies/Results: No results  found.  Anti-infectives: Anti-infectives    Start     Dose/Rate Route Frequency Ordered Stop   02/02/14 2100  piperacillin-tazobactam (ZOSYN) IVPB 3.375 g  Status:  Discontinued     3.375 g12.5 mL/hr over 240 Minutes Intravenous 3 times per day 02/02/14 1747 02/03/14 1034   02/02/14 0800  vancomycin (VANCOCIN) IVPB 1000 mg/200 mL premix  Status:  Discontinued     1,000 mg200 mL/hr over 60 Minutes Intravenous Every 12 hours 02/01/14 2336 02/04/14 1229   02/01/14 2359  piperacillin-tazobactam (ZOSYN) IVPB 3.375 g  Status:  Discontinued     3.375 g12.5 mL/hr over 240 Minutes Intravenous 3 times per day 02/01/14 2336 02/02/14 1747   02/01/14 2345  vancomycin (VANCOCIN) 1,500 mg in sodium chloride 0.9 % 500 mL IVPB     1,500 mg250 mL/hr over 120 Minutes Intravenous  Once 02/01/14 2336 02/02/14 0145   02/01/14 2330  piperacillin-tazobactam (ZOSYN) IVPB 3.375 g  Status:  Discontinued     3.375 g100 mL/hr over 30 Minutes Intravenous  Once 02/01/14 2328 02/01/14 2332   02/01/14 2330  vancomycin (VANCOCIN) IVPB 1000 mg/200 mL premix  Status:  Discontinued     1,000 mg200 mL/hr over 60 Minutes Intravenous  Once 02/01/14 2328 02/01/14 2332   02/01/14 1700  clindamycin (CLEOCIN) IVPB 600 mg     600 mg100 mL/hr over 30 Minutes Intravenous  Once 02/01/14 1658 02/01/14 1757       Assessment/Plan Recurrent hiatal hernia s/p Nissen Fundoplication (2012 - Dr. Johna SheriffHoxworth for GERD) Inflammatory changes to duodenum/jejunum Epigastric/LUQ abdominal pain - resolved Severe nausea - improving. Head CT normal. Gastric emptying study normal. Leukocytosis - 17.5 today ARF - improving, today Cr. 2.20. Renal U/S normal.   Persistent hypokalemia - today K+ 3.3  Plan: 1.  Per Dr. Luisa Hartornett, does have small recurrent hiatal hernia and slipped wrap.   2.  Appears to be chronic rather than acute, not convinced this is cause of nausea.  Nausea is much improved.  We would expect to see severe chest pain if she had volvulus  or ischemia, but pt denies any pain.  3.  Can follow up with Dr. Johna SheriffHoxworth as outpatient to discuss wrap revision if symptoms persist.  Have asked the office to make her an appointment in 2-3 weeks with him. 4.  Continue Reglan 5mg  with meals and before bed time as this seems to be helping significantly 5.  Could advance diet from D1 to D3 if tolerating.  May need protein supplements.    LOS: 8 days    Aris GeorgiaDORT, Val[place] 02/09/2014, 9:28 AM Pager: [phone]

## 2014-02-09 NOTE — Plan of Care (Signed)
Problem: Phase III Progression Outcomes Goal: Pain controlled on oral analgesia Outcome: Completed/Met Date Met:  02/09/14 Goal: Activity at appropriate level-compared to baseline (UP IN CHAIR FOR HEMODIALYSIS)  Outcome: Progressing

## 2014-02-09 NOTE — Plan of Care (Signed)
Problem: Phase II Progression Outcomes Goal: IV changed to normal saline lock Outcome: Completed/Met Date Met:  02/09/14  Problem: Phase III Progression Outcomes Goal: Voiding independently Outcome: Completed/Met Date Met:  02/09/14 Goal: IV/normal saline lock discontinued Outcome: Completed/Met Date Met:  02/09/14

## 2014-02-09 NOTE — Progress Notes (Signed)
PATIENT DETAILS Name: Sheri Mccoy Age: 73 y.o. Sex: female Date of Birth: Aug 05, 1940 Admit Date: 02/01/2014 Admitting Physician Alison Murray, MD WUJ:[id number],NWGNFAO Molly Maduro, MD  Brief Summary: 73 yo female with hx of HTN, Dyslipidemia, recent Right Knee Arthroplasty, recent UTI-admitted with several days hx of nausea-"dry heaving". She was found to have mild ileus of Abd Xray, mild cellulitis of her Right lower extremity. She was admitted for supportive care, IV Abx. Unfortunately, with patient having BM's and inspite of radiologic improvement no improvement in her symptoms. GI was subsequently consulted and underwent EGD/CT Abd/chest and Gastric emptying study-all negative (findings below).  Subjective: Much improved. Easily tolerating Dys 1 diet. No abdominal pain  Assessment/Plan: Principal Problem: Nausea/Vomiting:initially felt to be secondary to mild Ileus,however symptoms continuing in spite of radiological improvement and numerous bowel movements.Since she continued to have symptoms, GI consulted, EGD done on 11/3 which showed recurrence of her hiatal hernia.She then underwent CT Scan of abd/chest which was negative for major abnormaliteis. Gastric emptying study was negative. She was managed with supportive care. Now showing signs of improvement on IV Reglan, diet being slowly advanced. No further nausea/upper abdominal pain. Etiology of nausea/upper abdominal pain not clear.  Active Problems:   Leukocytosis: ?stress margination-no site of infection apparent-her leg is at baseline (has chronic skin changes. UA not really suggestive of UTI, Urine cs positive for 20 K colonies of multiple bacterial morphotypes. Blood culture 11/1 neg so far. Plans are to continue  monitor off antibiotics and panculture if febrile.Repeat blood culture on 11/6 pending. Patient afebrile, non toxic looking and clinically improved    Minimal Right Leg Cellulitis:significantly improved, per  patient-she had chronic mild erythema, leg back to baseline. Empirically started on both vancomycin and Zosyn on admission, Zosyn discontinued on 11/2, since hardly any erythema  Vancomycin stopped  on 11/3.Leg continues to remains stable this am.Dopper RLE neg for DVT   ZHY:QMVHQIO secondary to pre-renal process. Recent UA neg for proteinuria.Renal Ultrasound neg for hydronephroses. Strict I&O's, creatinine now slowly downtrending, euvolemic on exam. Will stop all IVF-as euvolemic and likely "tank is full". Tolerating diet well.    Hyponatremia:resolved, secondary to above    Hypokalemia: secondary to above. Will replete and recheck in am.Add K to IVF      HTN (hypertension):better, continue with Amlodipine,  Coreg-will follow and titrate accordingly, add prn Hydralazine.Micardis/HCTZ remains on hold.    HLD (hyperlipidemia):will hold statins for now given worsening labs  Disposition: Remain inpatient  Antibiotics:  IV Vancomycin 10/31>>11/4  IV Zosyn 10/31>>11/2  DVT Prophylaxis: Prophylactic Lovenox   Code Status: Full code   Family Communication Son-Chris [phone]-left message on 11/6 Spoke with spouse on 11/7  Procedures:  None  CONSULTS:  None  MEDICATIONS: Scheduled Meds: . amLODipine  10 mg Oral Daily  . atorvastatin  40 mg Oral Daily  . carvedilol  25 mg Oral BID WC  . enoxaparin (LOVENOX) injection  30 mg Subcutaneous Daily  . metoCLOPramide (REGLAN) injection  5 mg Intravenous TID AC & HS  . pantoprazole  40 mg Oral BID  . pneumococcal 23 valent vaccine  0.5 mL Intramuscular Tomorrow-1000  . potassium chloride  40 mEq Oral Once  . senna-docusate  2 tablet Oral BID   Continuous Infusions:   PRN Meds:.acetaminophen, hydrALAZINE, morphine injection, [DISCONTINUED] ondansetron **OR** ondansetron (ZOFRAN) IV, phenol, prochlorperazine  Antibiotics: Anti-infectives    Start     Dose/Rate Route Frequency Ordered Stop   02/02/14  2100   piperacillin-tazobactam (ZOSYN) IVPB 3.375 g  Status:  Discontinued     3.375 g12.5 mL/hr over 240 Minutes Intravenous 3 times per day 02/02/14 1747 02/03/14 1034   02/02/14 0800  vancomycin (VANCOCIN) IVPB 1000 mg/200 mL premix  Status:  Discontinued     1,000 mg200 mL/hr over 60 Minutes Intravenous Every 12 hours 02/01/14 2336 02/04/14 1229   02/01/14 2359  piperacillin-tazobactam (ZOSYN) IVPB 3.375 g  Status:  Discontinued     3.375 g12.5 mL/hr over 240 Minutes Intravenous 3 times per day 02/01/14 2336 02/02/14 1747   02/01/14 2345  vancomycin (VANCOCIN) 1,500 mg in sodium chloride 0.9 % 500 mL IVPB     1,500 mg250 mL/hr over 120 Minutes Intravenous  Once 02/01/14 2336 02/02/14 0145   02/01/14 2330  piperacillin-tazobactam (ZOSYN) IVPB 3.375 g  Status:  Discontinued     3.375 g100 mL/hr over 30 Minutes Intravenous  Once 02/01/14 2328 02/01/14 2332   02/01/14 2330  vancomycin (VANCOCIN) IVPB 1000 mg/200 mL premix  Status:  Discontinued     1,000 mg200 mL/hr over 60 Minutes Intravenous  Once 02/01/14 2328 02/01/14 2332   02/01/14 1700  clindamycin (CLEOCIN) IVPB 600 mg     600 mg100 mL/hr over 30 Minutes Intravenous  Once 02/01/14 1658 02/01/14 1757       PHYSICAL EXAM: Vital signs in last 24 hours: Filed Vitals:   02/08/14 2100 02/09/14 0521 02/09/14 0800 02/09/14 0953  BP: 129/71 153/63 153/69 135/70  Pulse: 75 77 81   Temp: 98.6 F (37 C) 98.5 F (36.9 C)    TempSrc: Oral Oral    Resp: 18 18    Height:      Weight:  97.7 kg (215 lb 6.2 oz)    SpO2: 99% 98%      Weight change: -0.6 kg (-1 lb 5.2 oz) Filed Weights   02/07/14 1335 02/08/14 0602 02/09/14 0521  Weight: 98.3 kg (216 lb 11.4 oz) 96.6 kg (212 lb 15.4 oz) 97.7 kg (215 lb 6.2 oz)   Body mass index is 36.95 kg/(m^2).   Gen Exam: Awake and alert with clear speech.Not in any distress Neck: Supple, No JVD.   Chest: B/L Clear.  No rales or rhonchi CVS: S1 S2 Regular, no murmurs.  Abdomen: soft, BS +, non tender and  non distended.  Extremities: no edema, lower extremities warm to touch.Mild stable chronic erythema to right leg. Neurologic: Non Focal.   Skin: No Rash.   Wounds: N/A.    Intake/Output from previous day:  Intake/Output Summary (Last 24 hours) at 02/09/14 1136 Last data filed at 02/09/14 0813  Gross per 24 hour  Intake   1540 ml  Output      0 ml  Net   1540 ml     LAB RESULTS: CBC  Recent Labs Lab 02/05/14 0859 02/06/14 0411 02/07/14 0610 02/08/14 0449 02/09/14 0500  WBC 15.3* 13.3* 16.7* 19.1* 17.5*  HGB 12.8 11.0* 11.5* 11.8* 10.9*  HCT 39.4 34.1* 35.9* 36.2 33.6*  PLT 368 315 355 379 352  MCV 87.0 87.0 88.4 86.0 88.4  MCH 28.3 28.1 28.3 28.0 28.7  MCHC 32.5 32.3 32.0 32.6 32.4  RDW 14.3 14.4 14.2 14.3 14.5  LYMPHSABS  --   --   --   --  2.1  MONOABS  --   --   --   --  1.5*  EOSABS  --   --   --   --  0.1  BASOSABS  --   --   --   --  0.0    Chemistries   Recent Labs Lab 02/03/14 0540  02/05/14 0859 02/06/14 0411 02/07/14 0610 02/08/14 0449 02/09/14 0500  NA 142  < > 139 139 139 137 137  K 3.6*  < > 2.9* 3.0* 3.0* 3.0* 3.3*  CL 103  < > 97 99 101 99 100  CO2 26  < > 24 23 22 23 21   GLUCOSE 110*  < > 122* 91 109* 101* 98  BUN 20  < > 22 25* 23 20 20   CREATININE 0.91  < > 2.55* 2.83* 2.57* 2.35* 2.20*  CALCIUM 8.3*  < > 8.6 7.8* 7.8* 7.8* 7.5*  MG 2.4  --   --   --   --   --   --   < > = values in this interval not displayed.  CBG: No results for input(s): GLUCAP in the last 168 hours.  GFR Estimated Creatinine Clearance: 26.2 mL/min (by C-G formula based on Cr of 2.2).  Coagulation profile No results for input(s): INR, PROTIME in the last 168 hours.  Cardiac Enzymes  Recent Labs Lab 02/05/14 0859  TROPONINI <0.30    Invalid input(s): POCBNP No results for input(s): DDIMER in the last 72 hours. No results for input(s): HGBA1C in the last 72 hours. No results for input(s): CHOL, HDL, LDLCALC, TRIG, CHOLHDL, LDLDIRECT in the last 72  hours. No results for input(s): TSH, T4TOTAL, T3FREE, THYROIDAB in the last 72 hours.  Invalid input(s): FREET3 No results for input(s): VITAMINB12, FOLATE, FERRITIN, TIBC, IRON, RETICCTPCT in the last 72 hours. No results for input(s): LIPASE, AMYLASE in the last 72 hours.  Urine Studies No results for input(s): UHGB, CRYS in the last 72 hours.  Invalid input(s): UACOL, UAPR, USPG, UPH, UTP, UGL, UKET, UBIL, UNIT, UROB, ULEU, UEPI, UWBC, URBC, UBAC, CAST, UCOM, BILUA  MICROBIOLOGY: Recent Results (from the past 240 hour(s))  Urine culture     Status: None   Collection Time: 02/01/14  2:56 PM  Result Value Ref Range Status   Specimen Description URINE, CLEAN CATCH  Final   Special Requests NONE  Final   Culture  Setup Time   Final    02/01/2014 23:54 Performed at [facility]    Colony Count   Final    20,OOO COLONIES/ML Performed at [facility]    Culture   Final    Multiple bacterial morphotypes present, none predominant. Suggest appropriate recollection if clinically indicated. Performed at [facility]    Report Status 02/02/2014 FINAL  Final  Culture, blood (routine x 2)     Status: None (Preliminary result)   Collection Time: 02/02/14 12:43 AM  Result Value Ref Range Status   Specimen Description BLOOD RIGHT HAND  Final   Special Requests BOTTLES DRAWN AEROBIC AND ANAEROBIC 4CC EA  Final   Culture  Setup Time   Final    02/02/2014 18:00 Performed at [facility]    Culture   Final           BLOOD CULTURE RECEIVED NO GROWTH TO DATE CULTURE WILL BE HELD FOR 5 DAYS BEFORE ISSUING A FINAL NEGATIVE REPORT Performed at [facility]    Report Status PENDING  Incomplete  Culture, blood (routine x 2)     Status: None (Preliminary result)   Collection Time: 02/02/14 12:48 AM  Result Value Ref Range Status   Specimen Description BLOOD LEFT HAND  Final   Special Requests BOTTLES DRAWN AEROBIC AND ANAEROBIC 5CC  EA  Final   Culture   Setup Time   Final    02/02/2014 18:00 Performed at [facility]    Culture   Final           BLOOD CULTURE RECEIVED NO GROWTH TO DATE CULTURE WILL BE HELD FOR 5 DAYS BEFORE ISSUING A FINAL NEGATIVE REPORT Performed at [facility]    Report Status PENDING  Incomplete    RADIOLOGY STUDIES/RESULTS: Ct Abdomen Pelvis Wo Contrast  02/05/2014   CLINICAL DATA:  Upper abdominal pain and chest pain for a few days with nausea and vomiting.  EXAM: CT CHEST, ABDOMEN AND PELVIS WITHOUT CONTRAST  TECHNIQUE: Multidetector CT imaging of the chest, abdomen and pelvis was performed following the standard protocol without IV contrast.  COMPARISON:  None.  FINDINGS: CT CHEST FINDINGS  Lungs are adequately inflated and demonstrate minimal linear scarring/ atelectasis over the posterior bases. Heart is normal in size. Subtle possible calcification over the left anterior descending coronary artery. Minimal calcified plaque involving thoracic aorta. No evidence of hilar or axillary adenopathy. There is a moderate size hiatal hernia. There is a small contrast collection along the inferior aspect of the diaphragmatic hiatus at the level of the hiatal hernia likely related patient's prior Nissen fundoplication.  CT ABDOMEN AND PELVIS FINDINGS  Abdominal images demonstrate evidence of a prior cholecystectomy. There is fatty atrophy of the pancreas. The spleen, liver and adrenal glands are within normal. Kidneys are normal in size, shape and position without evidence of nephrolithiasis. No evidence of obstruction. Ureters are within normal. Note that the mid to distal ureters are difficult to visualize due to moderate streak artifact from patient's left hip prosthesis as well as barium from recent fluoroscopic evaluation. There is mild diverticulosis of the colon. Appendix is not definitely seen as there is moderate streak artifact over the cecum. There is calcified plaque over the abdominal aorta and iliac  arteries. No evidence of bowel obstruction.  Subtle wall thickening involving the 3rd to 4th portion of the duodenum and proximal jejunum with very minimal ill definition of the fat planes adjacent the 3rd to 4th portion of the duodenum as cannot exclude peptic ulcer disease or regional enteritis.  Pelvic images demonstrate a calcified fibroid over the uterine fundus. There is a tiny amount of free fluid over the posterior pelvis. Bladder and rectum are otherwise unremarkable. There are mild degenerative changes of the spine and right hip.  IMPRESSION: Exam is somewhat limited due to streak artifact from retained barium and left hip prosthesis. There is subtle ill definition of the fat planes adjacent the 3rd to 4th portion of the duodenum with minimal wall thickening of this segment as well as proximal jejunum as cannot exclude peptic ulcer disease or regional enteritis.  No acute findings in the chest or pelvis.  Moderate size hiatal hernia with small contained contrast collection at the surgical site in this patient with prior Nissen fundoplication.  Minimal diverticulosis of the colon.  Tiny amount of nonspecific free fluid in the pelvis.   Electronically Signed   By: Elberta Fortis M.D.   On: 02/05/2014 13:08   Dg Chest 2 View  02/01/2014   CLINICAL DATA:  Cough.  EXAM: CHEST  2 VIEW  COMPARISON:  12/12/2013  FINDINGS: Borderline cardiomegaly remains stable. Mild bibasilar scarring is also unchanged. No evidence of pulmonary infiltrate or edema. No evidence of pleural effusion.  IMPRESSION: Stable exam.  No active disease.   Electronically Signed  By: Myles Rosenthal M.D.   On: 02/01/2014 15:40   Ct Head Wo Contrast  02/06/2014   CLINICAL DATA:  Persistent nausea of uncertain etiology, history hypertension  EXAM: CT HEAD WITHOUT CONTRAST  TECHNIQUE: Contiguous axial images were obtained from the base of the skull through the vertex without intravenous contrast.  COMPARISON:  None.  FINDINGS: There is mild  diffuse, confluent, periventricular white matter hypodensity, compatible with chronic small vessel white matter disease.  The gray-white differentiation is preserved.  There is no evidence of an acute infarct.  There is no acute intracranial hemorrhage.  There is no midline shift or mass effect.  There is no extra-axial fluid collection.  There is mild cerebral atrophy.  The paranasal sinuses and mastoid air cells are aerated.  The orbits are intact.  IMPRESSION: No acute intracranial process.   Electronically Signed   By: Fannie Knee   On: 02/06/2014 13:56   Ct Chest Wo Contrast  02/05/2014   CLINICAL DATA:  Upper abdominal pain and chest pain for a few days with nausea and vomiting.  EXAM: CT CHEST, ABDOMEN AND PELVIS WITHOUT CONTRAST  TECHNIQUE: Multidetector CT imaging of the chest, abdomen and pelvis was performed following the standard protocol without IV contrast.  COMPARISON:  None.  FINDINGS: CT CHEST FINDINGS  Lungs are adequately inflated and demonstrate minimal linear scarring/ atelectasis over the posterior bases. Heart is normal in size. Subtle possible calcification over the left anterior descending coronary artery. Minimal calcified plaque involving thoracic aorta. No evidence of hilar or axillary adenopathy. There is a moderate size hiatal hernia. There is a small contrast collection along the inferior aspect of the diaphragmatic hiatus at the level of the hiatal hernia likely related patient's prior Nissen fundoplication.  CT ABDOMEN AND PELVIS FINDINGS  Abdominal images demonstrate evidence of a prior cholecystectomy. There is fatty atrophy of the pancreas. The spleen, liver and adrenal glands are within normal. Kidneys are normal in size, shape and position without evidence of nephrolithiasis. No evidence of obstruction. Ureters are within normal. Note that the mid to distal ureters are difficult to visualize due to moderate streak artifact from patient's left hip prosthesis as well as  barium from recent fluoroscopic evaluation. There is mild diverticulosis of the colon. Appendix is not definitely seen as there is moderate streak artifact over the cecum. There is calcified plaque over the abdominal aorta and iliac arteries. No evidence of bowel obstruction.  Subtle wall thickening involving the 3rd to 4th portion of the duodenum and proximal jejunum with very minimal ill definition of the fat planes adjacent the 3rd to 4th portion of the duodenum as cannot exclude peptic ulcer disease or regional enteritis.  Pelvic images demonstrate a calcified fibroid over the uterine fundus. There is a tiny amount of free fluid over the posterior pelvis. Bladder and rectum are otherwise unremarkable. There are mild degenerative changes of the spine and right hip.  IMPRESSION: Exam is somewhat limited due to streak artifact from retained barium and left hip prosthesis. There is subtle ill definition of the fat planes adjacent the 3rd to 4th portion of the duodenum with minimal wall thickening of this segment as well as proximal jejunum as cannot exclude peptic ulcer disease or regional enteritis.  No acute findings in the chest or pelvis.  Moderate size hiatal hernia with small contained contrast collection at the surgical site in this patient with prior Nissen fundoplication.  Minimal diverticulosis of the colon.  Tiny amount of nonspecific free  fluid in the pelvis.   Electronically Signed   By: Elberta Fortis M.D.   On: 02/05/2014 13:08   Nm Gastric Emptying  02/06/2014   CLINICAL DATA:  Left upper quadrant abdominal pain. Nausea and vomiting.  EXAM: NUCLEAR MEDICINE GASTRIC EMPTYING SCAN  TECHNIQUE: After oral ingestion of radiolabeled meal, sequential abdominal images were obtained for 120 minutes. Residual percentage of activity remaining within the stomach was calculated at 60 and 120 minutes.  RADIOPHARMACEUTICALS:  2 mCi technetium 99-m labeled sulfur colloid  COMPARISON:  Upper GI 05/23/2007 and CT  abdomen and pelvis 02/05/2014  FINDINGS: Expected location of the stomach in the left upper quadrant. Ingested meal empties the stomach gradually over the course of the study with 27% retention at 60 min and 30% retention at 120 min (normal retention less than 30% at a 120 min).  IMPRESSION: Gastric emptying within normal limits.   Electronically Signed   By: Sebastian Ache   On: 02/06/2014 12:34   Dg Esophagus  02/04/2014   CLINICAL DATA:  Nausea and vomiting. Previous Nissen fundoplication. Question recurrent hiatal hernia or volvulus on endoscopy.  EXAM: ESOPHOGRAM/BARIUM SWALLOW  TECHNIQUE: Single contrast examination was performed using  thin barium.  FLUOROSCOPY TIME:  1 min 40 seconds  COMPARISON:  None.  FINDINGS: Impression on the distal esophagus is seen consistent with history of previous Nissen fundoplication. A small paraesophageal hernia is seen at this site, which may be due to loosening of the fundoplication wrap.  There is no evidence of esophageal obstruction or mass. There is no evidence of volvulus. Mild dilatation of thoracic esophagus is seen with severe dysmotility including tertiary contractions.  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 No evidence of esophageal mass, obstruction, or volvulus.  Esophageal dysmotility.   Electronically Signed   By: Myles Rosenthal M.D.   On: 02/04/2014 17:08   US Renal  02/06/2014   CLINICAL DATA:  Acute renal failure.  EXAM: RENAL/URINARY TRACT ULTRASOUND COMPLETE  COMPARISON:  CT abdomen and pelvis 02/05/2014 and abdominal ultrasound 05/15/2007  FINDINGS: Right Kidney:  Length: 13.3 cm. Echogenicity within normal limits. No mass or hydronephrosis visualized.  Left Kidney:  Length: 14.3 cm. Echogenicity within normal limits. No mass or hydronephrosis visualized.  Bladder:  Appears normal for degree of bladder distention.  IMPRESSION: No hydronephrosis or other  abnormality identified.   Electronically Signed   By: Sebastian Ache   On: 02/06/2014 09:36   Dg Abd 2 Views  02/03/2014   CLINICAL DATA:  Vomiting.  EXAM: ABDOMEN - 2 VIEW  COMPARISON:  Same day.  FINDINGS: The bowel gas pattern is normal. Status post cholecystectomy. There is no evidence of free air. No radio-opaque calculi or other significant radiographic abnormality is seen.  IMPRESSION: Bowel distention noted on prior exam has resolved. There is no evidence of bowel obstruction or ileus.   Electronically Signed   By: Roque Lias M.D.   On: 02/03/2014 10:21   Dg Abd Portable 1v  02/02/2014   CLINICAL DATA:  Abdominal pain.  EXAM: PORTABLE ABDOMEN - 1 VIEW  COMPARISON:  No prior .  FINDINGS: Soft tissue structures are unremarkable. Slightly distended air-filled loops of small large bowel noted. These findings most consistent with adynamic ileus. Surgical clips right upper quadrant consistent with cholecystectomy. Left hip replacement. Pelvic phleboliths. Degenerative changes lumbar spine right hip.  IMPRESSION: Findings most consistent with mild adynamic ileus.  Electronically Signed   By: Maisie Fushomas  Register   On: 02/02/2014 08:31    Jeoffrey MassedGHIMIRE,SHANKER, MD  [facility] Pager:[phone]  If 7PM-7AM, please contact night-coverage [email or web address] Password [email or web address] 02/09/2014, 11:36 AM   LOS: 8 days

## 2014-02-10 LAB — CBC
HEMATOCRIT: 34.1 % — AB (ref 36.0–46.0)
Hemoglobin: 10.8 g/dL — ABNORMAL LOW (ref 12.0–15.0)
MCH: 27.5 pg (ref 26.0–34.0)
MCHC: 31.7 g/dL (ref 30.0–36.0)
MCV: 86.8 fL (ref 78.0–100.0)
PLATELETS: 375 10*3/uL (ref 150–400)
RBC: 3.93 MIL/uL (ref 3.87–5.11)
RDW: 14.6 % (ref 11.5–15.5)
WBC: 15 10*3/uL — AB (ref 4.0–10.5)

## 2014-02-10 LAB — CULTURE, BLOOD (ROUTINE X 2)
Culture: NO GROWTH
Culture: NO GROWTH

## 2014-02-10 LAB — BASIC METABOLIC PANEL
Anion gap: 16 — ABNORMAL HIGH (ref 5–15)
BUN: 20 mg/dL (ref 6–23)
CHLORIDE: 100 meq/L (ref 96–112)
CO2: 23 meq/L (ref 19–32)
Calcium: 7.7 mg/dL — ABNORMAL LOW (ref 8.4–10.5)
Creatinine, Ser: 2.16 mg/dL — ABNORMAL HIGH (ref 0.50–1.10)
GFR calc Af Amer: 25 mL/min — ABNORMAL LOW (ref >90)
GFR calc non Af Amer: 22 mL/min — ABNORMAL LOW (ref >90)
Glucose, Bld: 101 mg/dL — ABNORMAL HIGH (ref 70–99)
Potassium: 3.3 mEq/L — ABNORMAL LOW (ref 3.7–5.3)
SODIUM: 139 meq/L (ref 137–147)

## 2014-02-10 MED ORDER — POTASSIUM CHLORIDE CRYS ER 20 MEQ PO TBCR
40.0000 meq | EXTENDED_RELEASE_TABLET | Freq: Two times a day (BID) | ORAL | Status: DC
  Administered 2014-02-10 – 2014-02-11 (×2): 40 meq via ORAL
  Filled 2014-02-10 (×4): qty 2

## 2014-02-10 MED ORDER — METOCLOPRAMIDE HCL 5 MG PO TABS
5.0000 mg | ORAL_TABLET | Freq: Three times a day (TID) | ORAL | Status: DC
  Administered 2014-02-10 – 2014-02-11 (×3): 5 mg via ORAL
  Filled 2014-02-10 (×10): qty 1

## 2014-02-10 NOTE — Plan of Care (Signed)
Problem: Phase III Progression Outcomes Goal: Activity at appropriate level-compared to baseline (UP IN CHAIR FOR HEMODIALYSIS)  Outcome: Completed/Met Date Met:  02/10/14 Goal: Foley discontinued Outcome: Not Applicable Date Met:  71/59/53 Goal: Discharge plan remains appropriate-arrangements made Outcome: Completed/Met Date Met:  02/10/14 Goal: Other Phase III Outcomes/Goals Outcome: Completed/Met Date Met:  02/10/14

## 2014-02-10 NOTE — Progress Notes (Signed)
PATIENT DETAILS Name: Sheri Mccoy Age: 73 y.o. Sex: female Date of Birth: 1940/07/28 Admit Date: 02/01/2014 Admitting Physician Alison Murray, MD ZOX:[id number] Molly Maduro, MD  Brief Summary: 73 yo female with hx of HTN, Dyslipidemia, recent Right Knee Arthroplasty, recent UTI-admitted with several days hx of nausea-"dry heaving". She was found to have mild ileus of Abd Xray, mild cellulitis of her Right lower extremity. She was admitted for supportive care, IV Abx. Unfortunately, with patient having BM's and inspite of radiologic improvement no improvement in her symptoms. GI was subsequently consulted and underwent EGD/CT Abd/chest and Gastric emptying study-all negative (findings below).  Subjective: Much improved. Tolerating diet, no further nausea or abd pain.  Assessment/Plan: Principal Problem: Nausea/Vomiting:initially felt to be secondary to mild Ileus,however symptoms continuing in spite of radiological improvement and numerous bowel movements.Since she continued to have symptoms, GI consulted, EGD done on 11/3 which showed recurrence of her hiatal hernia.She then underwent CT Scan of abd/chest which was negative for major abnormaliteis. Gastric emptying study was negative. She was managed with supportive care. Now  improving on IV Reglan-will change to oral Reglan, advance diet to Dys 3. No further nausea/upper abdominal pain. Etiology of nausea/upper abdominal pain not clear.Suspect home on 11/10 if clinical improvement continues  Active Problems:   Leukocytosis: ?stress margination-no site of infection apparent-her leg is at baseline (has chronic skin changes. UA not really suggestive of UTI, Urine cs positive for 20 K colonies of multiple bacterial morphotypes. Blood culture 11/1 on 11/6 neg so far. Plans are to continue  monitor off antibiotics and panculture if febrile. However much improved    Minimal Right Leg Cellulitis:significantly improved, per patient-she had  chronic mild erythema, leg back to baseline. Empirically started on both vancomycin and Zosyn on admission, Zosyn discontinued on 11/2, since hardly any erythema  Vancomycin stopped  on 11/3.Leg continues to remains stable this am.Dopper RLE neg for DVT   JYN:WGNFAOZ secondary to pre-renal process. Recent UA neg for proteinuria.Renal Ultrasound neg for hydronephroses. Strict I&O's, creatinine now slowly downtrending, euvolemic on exam. Off all IVF-as euvolemic and likely "tank is full". Tolerating diet well.    Hyponatremia:resolved, secondary to above    Hypokalemia: secondary to above. Will replete and recheck in am.Add K to IVF      HTN (hypertension):better, continue with Amlodipine,  Coreg-will follow and titrate accordingly, add prn Hydralazine.Micardis/HCTZ remains on hold.    HLD (hyperlipidemia):will hold statins for now given worsening labs  Disposition: Remain inpatient  Antibiotics:  IV Vancomycin 10/31>>11/4  IV Zosyn 10/31>>11/2  DVT Prophylaxis: Prophylactic Lovenox   Code Status: Full code   Family Communication None at bedside this am  Procedures:  None  CONSULTS:  None  MEDICATIONS: Scheduled Meds: . amLODipine  10 mg Oral Daily  . atorvastatin  40 mg Oral Daily  . carvedilol  12.5 mg Oral BID WC  . enoxaparin (LOVENOX) injection  30 mg Subcutaneous Daily  . metoCLOPramide  5 mg Oral TID AC & HS  . pantoprazole  40 mg Oral BID  . pneumococcal 23 valent vaccine  0.5 mL Intramuscular Tomorrow-1000  . potassium chloride  40 mEq Oral BID  . senna-docusate  2 tablet Oral BID   Continuous Infusions:   PRN Meds:.acetaminophen, hydrALAZINE, morphine injection, [DISCONTINUED] ondansetron **OR** ondansetron (ZOFRAN) IV, phenol, prochlorperazine  Antibiotics: Anti-infectives    Start     Dose/Rate Route Frequency Ordered Stop   02/02/14 2100  piperacillin-tazobactam (ZOSYN) IVPB 3.375  g  Status:  Discontinued     3.375 g12.5 mL/hr over 240 Minutes  Intravenous 3 times per day 02/02/14 1747 02/03/14 1034   02/02/14 0800  vancomycin (VANCOCIN) IVPB 1000 mg/200 mL premix  Status:  Discontinued     1,000 mg200 mL/hr over 60 Minutes Intravenous Every 12 hours 02/01/14 2336 02/04/14 1229   02/01/14 2359  piperacillin-tazobactam (ZOSYN) IVPB 3.375 g  Status:  Discontinued     3.375 g12.5 mL/hr over 240 Minutes Intravenous 3 times per day 02/01/14 2336 02/02/14 1747   02/01/14 2345  vancomycin (VANCOCIN) 1,500 mg in sodium chloride 0.9 % 500 mL IVPB     1,500 mg250 mL/hr over 120 Minutes Intravenous  Once 02/01/14 2336 02/02/14 0145   02/01/14 2330  piperacillin-tazobactam (ZOSYN) IVPB 3.375 g  Status:  Discontinued     3.375 g100 mL/hr over 30 Minutes Intravenous  Once 02/01/14 2328 02/01/14 2332   02/01/14 2330  vancomycin (VANCOCIN) IVPB 1000 mg/200 mL premix  Status:  Discontinued     1,000 mg200 mL/hr over 60 Minutes Intravenous  Once 02/01/14 2328 02/01/14 2332   02/01/14 1700  clindamycin (CLEOCIN) IVPB 600 mg     600 mg100 mL/hr over 30 Minutes Intravenous  Once 02/01/14 1658 02/01/14 1757       PHYSICAL EXAM: Vital signs in last 24 hours: Filed Vitals:   02/09/14 1455 02/09/14 1625 02/09/14 2054 02/10/14 0754  BP: 147/77 146/68 147/59 153/68  Pulse: 81 75 78 83  Temp: 98.5 F (36.9 C)  98.5 F (36.9 C) 98.6 F (37 C)  TempSrc: Oral  Oral Oral  Resp: 18  18 16   Height:      Weight:    95.5 kg (210 lb 8.6 oz)  SpO2: 96%  98% 96%    Weight change:  Filed Weights   02/08/14 0602 02/09/14 0521 02/10/14 0754  Weight: 96.6 kg (212 lb 15.4 oz) 97.7 kg (215 lb 6.2 oz) 95.5 kg (210 lb 8.6 oz)   Body mass index is 36.12 kg/(m^2).   Gen Exam: Awake and alert with clear speech.Not in any distress Neck: Supple, No JVD.   Chest: B/L Clear.  No rales or rhonchi CVS: S1 S2 Regular, no murmurs.  Abdomen: soft, BS +, non tender and non distended.  Extremities: no edema, lower extremities warm to touch.Mild stable chronic erythema to  right leg. Neurologic: Non Focal.   Skin: No Rash.   Wounds: N/A.    Intake/Output from previous day:  Intake/Output Summary (Last 24 hours) at 02/10/14 1043 Last data filed at 02/09/14 2200  Gross per 24 hour  Intake    600 ml  Output      0 ml  Net    600 ml     LAB RESULTS: CBC  Recent Labs Lab 02/06/14 0411 02/07/14 0610 02/08/14 0449 02/09/14 0500 02/10/14 0501  WBC 13.3* 16.7* 19.1* 17.5* 15.0*  HGB 11.0* 11.5* 11.8* 10.9* 10.8*  HCT 34.1* 35.9* 36.2 33.6* 34.1*  PLT 315 355 379 352 375  MCV 87.0 88.4 86.0 88.4 86.8  MCH 28.1 28.3 28.0 28.7 27.5  MCHC 32.3 32.0 32.6 32.4 31.7  RDW 14.4 14.2 14.3 14.5 14.6  LYMPHSABS  --   --   --  2.1  --   MONOABS  --   --   --  1.5*  --   EOSABS  --   --   --  0.1  --   BASOSABS  --   --   --  0.0  --     Chemistries   Recent Labs Lab 02/06/14 0411 02/07/14 0610 02/08/14 0449 02/09/14 0500 02/10/14 0501  NA 139 139 137 137 139  K 3.0* 3.0* 3.0* 3.3* 3.3*  CL 99 101 99 100 100  CO2 23 22 23 21 23   GLUCOSE 91 109* 101* 98 101*  BUN 25* 23 20 20 20   CREATININE 2.83* 2.57* 2.35* 2.20* 2.16*  CALCIUM 7.8* 7.8* 7.8* 7.5* 7.7*    CBG: No results for input(s): GLUCAP in the last 168 hours.  GFR Estimated Creatinine Clearance: 26.4 mL/min (by C-G formula based on Cr of 2.16).  Coagulation profile No results for input(s): INR, PROTIME in the last 168 hours.  Cardiac Enzymes  Recent Labs Lab 02/05/14 0859  TROPONINI <0.30    Invalid input(s): POCBNP No results for input(s): DDIMER in the last 72 hours. No results for input(s): HGBA1C in the last 72 hours. No results for input(s): CHOL, HDL, LDLCALC, TRIG, CHOLHDL, LDLDIRECT in the last 72 hours. No results for input(s): TSH, T4TOTAL, T3FREE, THYROIDAB in the last 72 hours.  Invalid input(s): FREET3 No results for input(s): VITAMINB12, FOLATE, FERRITIN, TIBC, IRON, RETICCTPCT in the last 72 hours. No results for input(s): LIPASE, AMYLASE in the last 72  hours.  Urine Studies No results for input(s): UHGB, CRYS in the last 72 hours.  Invalid input(s): UACOL, UAPR, USPG, UPH, UTP, UGL, UKET, UBIL, UNIT, UROB, ULEU, UEPI, UWBC, URBC, UBAC, CAST, UCOM, BILUA  MICROBIOLOGY: Recent Results (from the past 240 hour(s))  Urine culture     Status: None   Collection Time: 02/01/14  2:56 PM  Result Value Ref Range Status   Specimen Description URINE, CLEAN CATCH  Final   Special Requests NONE  Final   Culture  Setup Time   Final    02/01/2014 23:54 Performed at [facility]    Colony Count   Final    20,OOO COLONIES/ML Performed at [facility]    Culture   Final    Multiple bacterial morphotypes present, none predominant. Suggest appropriate recollection if clinically indicated. Performed at [facility]    Report Status 02/02/2014 FINAL  Final  Culture, blood (routine x 2)     Status: None   Collection Time: 02/02/14 12:43 AM  Result Value Ref Range Status   Specimen Description BLOOD RIGHT HAND  Final   Special Requests BOTTLES DRAWN AEROBIC AND ANAEROBIC 4CC EA  Final   Culture  Setup Time   Final    02/02/2014 18:00 Performed at [facility]    Culture   Final    NO GROWTH 5 DAYS Performed at [facility]    Report Status 02/10/2014 FINAL  Final  Culture, blood (routine x 2)     Status: None   Collection Time: 02/02/14 12:48 AM  Result Value Ref Range Status   Specimen Description BLOOD LEFT HAND  Final   Special Requests BOTTLES DRAWN AEROBIC AND ANAEROBIC 5CC EA  Final   Culture  Setup Time   Final    02/02/2014 18:00 Performed at [facility]    Culture   Final    NO GROWTH 5 DAYS Performed at [facility]    Report Status 02/10/2014 FINAL  Final  Culture, blood (routine x 2)     Status: None (Preliminary result)   Collection Time: 02/07/14 11:55 AM  Result Value Ref Range Status   Specimen Description BLOOD LEFT HAND  Final  Special Requests BOTTLES  DRAWN AEROBIC ONLY 10CC  Final   Culture  Setup Time   Final    02/07/2014 18:37 Performed at [facility]    Culture   Final           BLOOD CULTURE RECEIVED NO GROWTH TO DATE CULTURE WILL BE HELD FOR 5 DAYS BEFORE ISSUING A FINAL NEGATIVE REPORT Performed at [facility]    Report Status PENDING  Incomplete  Culture, blood (routine x 2)     Status: None (Preliminary result)   Collection Time: 02/07/14 12:10 PM  Result Value Ref Range Status   Specimen Description BLOOD LEFT HAND  Final   Special Requests BOTTLES DRAWN AEROBIC ONLY 5CC  Final   Culture  Setup Time   Final    02/07/2014 18:38 Performed at [facility]    Culture   Final           BLOOD CULTURE RECEIVED NO GROWTH TO DATE CULTURE WILL BE HELD FOR 5 DAYS BEFORE ISSUING A FINAL NEGATIVE REPORT Performed at [facility]    Report Status PENDING  Incomplete    RADIOLOGY STUDIES/RESULTS: Ct Abdomen Pelvis Wo Contrast  02/05/2014   CLINICAL DATA:  Upper abdominal pain and chest pain for a few days with nausea and vomiting.  EXAM: CT CHEST, ABDOMEN AND PELVIS WITHOUT CONTRAST  TECHNIQUE: Multidetector CT imaging of the chest, abdomen and pelvis was performed following the standard protocol without IV contrast.  COMPARISON:  None.  FINDINGS: CT CHEST FINDINGS  Lungs are adequately inflated and demonstrate minimal linear scarring/ atelectasis over the posterior bases. Heart is normal in size. Subtle possible calcification over the left anterior descending coronary artery. Minimal calcified plaque involving thoracic aorta. No evidence of hilar or axillary adenopathy. There is a moderate size hiatal hernia. There is a small contrast collection along the inferior aspect of the diaphragmatic hiatus at the level of the hiatal hernia likely related patient's prior Nissen fundoplication.  CT ABDOMEN AND PELVIS FINDINGS  Abdominal images demonstrate evidence of a prior cholecystectomy. There is fatty atrophy  of the pancreas. The spleen, liver and adrenal glands are within normal. Kidneys are normal in size, shape and position without evidence of nephrolithiasis. No evidence of obstruction. Ureters are within normal. Note that the mid to distal ureters are difficult to visualize due to moderate streak artifact from patient's left hip prosthesis as well as barium from recent fluoroscopic evaluation. There is mild diverticulosis of the colon. Appendix is not definitely seen as there is moderate streak artifact over the cecum. There is calcified plaque over the abdominal aorta and iliac arteries. No evidence of bowel obstruction.  Subtle wall thickening involving the 3rd to 4th portion of the duodenum and proximal jejunum with very minimal ill definition of the fat planes adjacent the 3rd to 4th portion of the duodenum as cannot exclude peptic ulcer disease or regional enteritis.  Pelvic images demonstrate a calcified fibroid over the uterine fundus. There is a tiny amount of free fluid over the posterior pelvis. Bladder and rectum are otherwise unremarkable. There are mild degenerative changes of the spine and right hip.  IMPRESSION: Exam is somewhat limited due to streak artifact from retained barium and left hip prosthesis. There is subtle ill definition of the fat planes adjacent the 3rd to 4th portion of the duodenum with minimal wall thickening of this segment as well as proximal jejunum as cannot exclude peptic ulcer disease or regional enteritis.  No  acute findings in the chest or pelvis.  Moderate size hiatal hernia with small contained contrast collection at the surgical site in this patient with prior Nissen fundoplication.  Minimal diverticulosis of the colon.  Tiny amount of nonspecific free fluid in the pelvis.   Electronically Signed   By: Elberta Fortis M.D.   On: 02/05/2014 13:08   Dg Chest 2 View  02/01/2014   CLINICAL DATA:  Cough.  EXAM: CHEST  2 VIEW  COMPARISON:  12/12/2013  FINDINGS: Borderline  cardiomegaly remains stable. Mild bibasilar scarring is also unchanged. No evidence of pulmonary infiltrate or edema. No evidence of pleural effusion.  IMPRESSION: Stable exam.  No active disease.   Electronically Signed   By: Myles Rosenthal M.D.   On: 02/01/2014 15:40   Ct Head Wo Contrast  02/06/2014   CLINICAL DATA:  Persistent nausea of uncertain etiology, history hypertension  EXAM: CT HEAD WITHOUT CONTRAST  TECHNIQUE: Contiguous axial images were obtained from the base of the skull through the vertex without intravenous contrast.  COMPARISON:  None.  FINDINGS: There is mild diffuse, confluent, periventricular white matter hypodensity, compatible with chronic small vessel white matter disease.  The gray-white differentiation is preserved.  There is no evidence of an acute infarct.  There is no acute intracranial hemorrhage.  There is no midline shift or mass effect.  There is no extra-axial fluid collection.  There is mild cerebral atrophy.  The paranasal sinuses and mastoid air cells are aerated.  The orbits are intact.  IMPRESSION: No acute intracranial process.   Electronically Signed   By: Fannie Knee   On: 02/06/2014 13:56   Ct Chest Wo Contrast  02/05/2014   CLINICAL DATA:  Upper abdominal pain and chest pain for a few days with nausea and vomiting.  EXAM: CT CHEST, ABDOMEN AND PELVIS WITHOUT CONTRAST  TECHNIQUE: Multidetector CT imaging of the chest, abdomen and pelvis was performed following the standard protocol without IV contrast.  COMPARISON:  None.  FINDINGS: CT CHEST FINDINGS  Lungs are adequately inflated and demonstrate minimal linear scarring/ atelectasis over the posterior bases. Heart is normal in size. Subtle possible calcification over the left anterior descending coronary artery. Minimal calcified plaque involving thoracic aorta. No evidence of hilar or axillary adenopathy. There is a moderate size hiatal hernia. There is a small contrast collection along the inferior aspect of the  diaphragmatic hiatus at the level of the hiatal hernia likely related patient's prior Nissen fundoplication.  CT ABDOMEN AND PELVIS FINDINGS  Abdominal images demonstrate evidence of a prior cholecystectomy. There is fatty atrophy of the pancreas. The spleen, liver and adrenal glands are within normal. Kidneys are normal in size, shape and position without evidence of nephrolithiasis. No evidence of obstruction. Ureters are within normal. Note that the mid to distal ureters are difficult to visualize due to moderate streak artifact from patient's left hip prosthesis as well as barium from recent fluoroscopic evaluation. There is mild diverticulosis of the colon. Appendix is not definitely seen as there is moderate streak artifact over the cecum. There is calcified plaque over the abdominal aorta and iliac arteries. No evidence of bowel obstruction.  Subtle wall thickening involving the 3rd to 4th portion of the duodenum and proximal jejunum with very minimal ill definition of the fat planes adjacent the 3rd to 4th portion of the duodenum as cannot exclude peptic ulcer disease or regional enteritis.  Pelvic images demonstrate a calcified fibroid over the uterine fundus. There is a tiny amount  of free fluid over the posterior pelvis. Bladder and rectum are otherwise unremarkable. There are mild degenerative changes of the spine and right hip.  IMPRESSION: Exam is somewhat limited due to streak artifact from retained barium and left hip prosthesis. There is subtle ill definition of the fat planes adjacent the 3rd to 4th portion of the duodenum with minimal wall thickening of this segment as well as proximal jejunum as cannot exclude peptic ulcer disease or regional enteritis.  No acute findings in the chest or pelvis.  Moderate size hiatal hernia with small contained contrast collection at the surgical site in this patient with prior Nissen fundoplication.  Minimal diverticulosis of the colon.  Tiny amount of  nonspecific free fluid in the pelvis.   Electronically Signed   By: Elberta Fortis M.D.   On: 02/05/2014 13:08   Nm Gastric Emptying  02/06/2014   CLINICAL DATA:  Left upper quadrant abdominal pain. Nausea and vomiting.  EXAM: NUCLEAR MEDICINE GASTRIC EMPTYING SCAN  TECHNIQUE: After oral ingestion of radiolabeled meal, sequential abdominal images were obtained for 120 minutes. Residual percentage of activity remaining within the stomach was calculated at 60 and 120 minutes.  RADIOPHARMACEUTICALS:  2 mCi technetium 99-m labeled sulfur colloid  COMPARISON:  Upper GI 05/23/2007 and CT abdomen and pelvis 02/05/2014  FINDINGS: Expected location of the stomach in the left upper quadrant. Ingested meal empties the stomach gradually over the course of the study with 27% retention at 60 min and 30% retention at 120 min (normal retention less than 30% at a 120 min).  IMPRESSION: Gastric emptying within normal limits.   Electronically Signed   By: Sebastian Ache   On: 02/06/2014 12:34   Dg Esophagus  02/04/2014   CLINICAL DATA:  Nausea and vomiting. Previous Nissen fundoplication. Question recurrent hiatal hernia or volvulus on endoscopy.  EXAM: ESOPHOGRAM/BARIUM SWALLOW  TECHNIQUE: Single contrast examination was performed using  thin barium.  FLUOROSCOPY TIME:  1 min 40 seconds  COMPARISON:  None.  FINDINGS: Impression on the distal esophagus is seen consistent with history of previous Nissen fundoplication. A small paraesophageal hernia is seen at this site, which may be due to loosening of the fundoplication wrap.  There is no evidence of esophageal obstruction or mass. There is no evidence of volvulus. Mild dilatation of thoracic esophagus is seen with severe dysmotility including tertiary contractions.  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 No evidence of esophageal mass, obstruction, or volvulus.  Esophageal  dysmotility.   Electronically Signed   By: Myles Rosenthal M.D.   On: 02/04/2014 17:08   US Renal  02/06/2014   CLINICAL DATA:  Acute renal failure.  EXAM: RENAL/URINARY TRACT ULTRASOUND COMPLETE  COMPARISON:  CT abdomen and pelvis 02/05/2014 and abdominal ultrasound 05/15/2007  FINDINGS: Right Kidney:  Length: 13.3 cm. Echogenicity within normal limits. No mass or hydronephrosis visualized.  Left Kidney:  Length: 14.3 cm. Echogenicity within normal limits. No mass or hydronephrosis visualized.  Bladder:  Appears normal for degree of bladder distention.  IMPRESSION: No hydronephrosis or other abnormality identified.   Electronically Signed   By: Sebastian Ache   On: 02/06/2014 09:36   Dg Abd 2 Views  02/03/2014   CLINICAL DATA:  Vomiting.  EXAM: ABDOMEN - 2 VIEW  COMPARISON:  Same day.  FINDINGS: The bowel gas pattern is normal. Status post cholecystectomy. There is no evidence of free air. No radio-opaque calculi or  other significant radiographic abnormality is seen.  IMPRESSION: Bowel distention noted on prior exam has resolved. There is no evidence of bowel obstruction or ileus.   Electronically Signed   By: Roque Lias M.D.   On: 02/03/2014 10:21   Dg Abd Portable 1v  02/02/2014   CLINICAL DATA:  Abdominal pain.  EXAM: PORTABLE ABDOMEN - 1 VIEW  COMPARISON:  No prior .  FINDINGS: Soft tissue structures are unremarkable. Slightly distended air-filled loops of small large bowel noted. These findings most consistent with adynamic ileus. Surgical clips right upper quadrant consistent with cholecystectomy. Left hip replacement. Pelvic phleboliths. Degenerative changes lumbar spine right hip.  IMPRESSION: Findings most consistent with mild adynamic ileus.   Electronically Signed   By: Maisie Fus  Register   On: 02/02/2014 08:31    Jeoffrey Massed, MD  [facility] Pager:[phone]  If 7PM-7AM, please contact night-coverage [email or web address] Password [email or web address] 02/10/2014, 10:43 AM   LOS: 9 days

## 2014-02-10 NOTE — Progress Notes (Signed)
[facility] PROGRESS NOTE Subjective Patient tolerating diet without nausea or vomiting.  Objective: Vital signs in last 24 hours: Temp:  [98.5 F (36.9 C)-98.6 F (37 C)] 98.6 F (37 C) (11/09 0754) Pulse Rate:  [75-83] 83 (11/09 0754) Resp:  [16-18] 16 (11/09 0754) BP: (135-153)/(59-77) 153/68 mmHg (11/09 0754) SpO2:  [96 %-98 %] 96 % (11/09 0754) Weight:  [95.5 kg (210 lb 8.6 oz)] 95.5 kg (210 lb 8.6 oz) (11/09 0754) Last BM Date: 02/09/14  Intake/Output from previous day: 11/08 0701 - 11/09 0700 In: 940 [P.O.:840; IV Piggyback:100] Out: -  Intake/Output this shift:    PE: General--no distress, just finished breakfast, ate nearly all of her breakfast.   Abdomen--soft and nontender  Lab Results:  Recent Labs  02/08/14 0449 02/09/14 0500 02/10/14 0501  WBC 19.1* 17.5* 15.0*  HGB 11.8* 10.9* 10.8*  HCT 36.2 33.6* 34.1*  PLT 379 352 375   BMET  Recent Labs  02/08/14 0449 02/09/14 0500 02/10/14 0501  NA 137 137 139  K 3.0* 3.3* 3.3*  CL 99 100 100  CO2 23 21 23   CREATININE 2.35* 2.20* 2.16*   LFT No results for input(s): PROT, AST, ALT, ALKPHOS, BILITOT, BILIDIR, IBILI in the last 72 hours. PT/INR No results for input(s): LABPROT, INR in the last 72 hours. PANCREAS No results for input(s): LIPASE in the last 72 hours.       Studies/Results: No results found.  Medications: I have reviewed the patient's current medications.  Assessment/Plan: 1. Nausea and vomiting/abdominal pain. Appears to be completely resolved. Metoclopramide seems to be helping. At this point I would send her home on protonix 40 mg BID and metoclopramide 5 mg AC and HS. She can follow-up in the office with Dr. Madilyn FiremanHayes in 2 to 3 weeks.   Sheri Mccoy,Sheri Mccoy 02/10/2014, 8:52 AM

## 2014-02-10 NOTE — Progress Notes (Signed)
Patient ID: Sheri JensenGwynn D Laurich, female   DOB: 1940/06/18, 73 y.o.   MRN: [id number] 6 Days Post-Op  Subjective: Pt feeling well.  Eating better.  No nausea currently  Objective: Vital signs in last 24 hours: Temp:  [98.5 F (36.9 C)-98.6 F (37 C)] 98.6 F (37 C) (11/09 0754) Pulse Rate:  [75-83] 83 (11/09 0754) Resp:  [16-18] 16 (11/09 0754) BP: (146-153)/(59-77) 153/68 mmHg (11/09 0754) SpO2:  [96 %-98 %] 96 % (11/09 0754) Weight:  [210 lb 8.6 oz (95.5 kg)] 210 lb 8.6 oz (95.5 kg) (11/09 0754) Last BM Date: 02/09/14  Intake/Output from previous day: 11/08 0701 - 11/09 0700 In: 940 [P.O.:840; IV Piggyback:100] Out: -  Intake/Output this shift:    PE: Abd: soft, NT, ND, +BS  Lab Results:   Recent Labs  02/09/14 0500 02/10/14 0501  WBC 17.5* 15.0*  HGB 10.9* 10.8*  HCT 33.6* 34.1*  PLT 352 375   BMET  Recent Labs  02/09/14 0500 02/10/14 0501  NA 137 139  K 3.3* 3.3*  CL 100 100  CO2 21 23  GLUCOSE 98 101*  BUN 20 20  CREATININE 2.20* 2.16*  CALCIUM 7.5* 7.7*   PT/INR No results for input(s): LABPROT, INR in the last 72 hours. CMP     Component Value Date/Time   NA 139 02/10/2014 0501   K 3.3* 02/10/2014 0501   CL 100 02/10/2014 0501   CO2 23 02/10/2014 0501   GLUCOSE 101* 02/10/2014 0501   BUN 20 02/10/2014 0501   CREATININE 2.16* 02/10/2014 0501   CALCIUM 7.7* 02/10/2014 0501   PROT 6.6 02/05/2014 0859   ALBUMIN 3.1* 02/05/2014 0859   AST 26 02/05/2014 0859   ALT 26 02/05/2014 0859   ALKPHOS 116 02/05/2014 0859   BILITOT 0.3 02/05/2014 0859   GFRNONAA 22* 02/10/2014 0501   GFRAA 25* 02/10/2014 0501   Lipase     Component Value Date/Time   LIPASE 22 02/05/2014 0859       Studies/Results: No results found.  Anti-infectives: Anti-infectives    Start     Dose/Rate Route Frequency Ordered Stop   02/02/14 2100  piperacillin-tazobactam (ZOSYN) IVPB 3.375 g  Status:  Discontinued     3.375 g12.5 mL/hr over 240 Minutes Intravenous 3 times  per day 02/02/14 1747 02/03/14 1034   02/02/14 0800  vancomycin (VANCOCIN) IVPB 1000 mg/200 mL premix  Status:  Discontinued     1,000 mg200 mL/hr over 60 Minutes Intravenous Every 12 hours 02/01/14 2336 02/04/14 1229   02/01/14 2359  piperacillin-tazobactam (ZOSYN) IVPB 3.375 g  Status:  Discontinued     3.375 g12.5 mL/hr over 240 Minutes Intravenous 3 times per day 02/01/14 2336 02/02/14 1747   02/01/14 2345  vancomycin (VANCOCIN) 1,500 mg in sodium chloride 0.9 % 500 mL IVPB     1,500 mg250 mL/hr over 120 Minutes Intravenous  Once 02/01/14 2336 02/02/14 0145   02/01/14 2330  piperacillin-tazobactam (ZOSYN) IVPB 3.375 g  Status:  Discontinued     3.375 g100 mL/hr over 30 Minutes Intravenous  Once 02/01/14 2328 02/01/14 2332   02/01/14 2330  vancomycin (VANCOCIN) IVPB 1000 mg/200 mL premix  Status:  Discontinued     1,000 mg200 mL/hr over 60 Minutes Intravenous  Once 02/01/14 2328 02/01/14 2332   02/01/14 1700  clindamycin (CLEOCIN) IVPB 600 mg     600 mg100 mL/hr over 30 Minutes Intravenous  Once 02/01/14 1658 02/01/14 1757       Assessment/Plan  1. Recurrent hiatal  hernia  Plan: 1. Cont current diet 2. She is to follow up with Dr. Johna SheriffHoxworth in our office as an outpatient to discuss this hernia and further management. 3. We will sign off.   LOS: 9 days    Gad Aymond E 02/10/2014, 11:20 AM Pager: [phone]

## 2014-02-10 NOTE — Progress Notes (Signed)
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 Pt denies any known triggering factors but explains she was recently started on Macrobid for UTI and it seems to her, nausea started right after she was started on the medication. She also reports upper abd quadrants discomfort; low grade fever 99.5 F, physical exam fairly unremarkable but right foot erythema noted and findings consistent with cellulitis.     PT Comments    Much improved activity tolerance, with incr amb distance and good participation in therex; Discussed rescheduling follow-up appt with Dr. Ranell PatrickNorris, and having Outpatient PT follow up; On track for dc tomorrow from a PT standpoint  Follow Up Recommendations  Outpatient PT;Supervision/Assistance - 24 hour     Equipment Recommendations  None recommended by PT    Recommendations for Other Services       Precautions / Restrictions Precautions Precautions: None Restrictions Weight Bearing Restrictions: No RLE Weight Bearing: Weight bearing as tolerated    Mobility  Bed Mobility Overal bed mobility: Modified Independent                Transfers Overall transfer level: Needs assistance Equipment used: None Transfers: Sit to/from Stand Sit to Stand: Supervision         General transfer comment: Supervision for safety. Performed from lowest bed setting. No physical assist required.  Ambulation/Gait Ambulation/Gait assistance: Supervision Ambulation Distance (Feet): 120 Feet Assistive device: Straight cane Gait Pattern/deviations: Step-through pattern;Decreased stance time - right     General Gait Details: Much better activity tolerance, with  significantly incr amb distance   Stairs Stairs:  (She'll consider reviewing stairs for tomorrow)          Wheelchair Mobility    Modified Rankin (Stroke Patients Only)       Balance                                    Cognition Arousal/Alertness: Awake/alert Behavior During Therapy: WFL for tasks assessed/performed Overall Cognitive Status: Within Functional Limits for tasks assessed                      Exercises General Exercises - Lower Extremity Long Arc Quad: Strengthening;Right;10 reps;Seated    General Comments  Demonstrated scar mobilization to pt      Pertinent Vitals/Pain Pain Assessment: No/denies pain    Home Living                      Prior Function            PT Goals (current goals can now be found in the care plan section) Acute Rehab PT Goals Patient Stated Goal: wants to feel better PT Goal Formulation: With patient Time For Goal Achievement: 02/17/14 Potential to Achieve Goals: Good Progress towards PT goals: Progressing toward goals    Frequency  Min 3X/week    PT Plan Current plan remains appropriate    Co-evaluation             End of Session   Activity Tolerance: Patient tolerated treatment well Patient left: in chair;with call bell/phone within reach  Time: 0922-0938 PT Time Calculation (min): 16 min  Charges:  $Gait Training: 8-22 mins                    G Codes:      Van ClinesGarrigan, Rhyan Wolters Hamff 02/10/2014, 10:01 AM  Van ClinesHolly Louanne Calvillo, PT  Acute Rehabilitation Services Pager [phone] Office [phone]

## 2014-02-11 LAB — BASIC METABOLIC PANEL
Anion gap: 15 (ref 5–15)
BUN: 21 mg/dL (ref 6–23)
CO2: 25 mEq/L (ref 19–32)
Calcium: 8.3 mg/dL — ABNORMAL LOW (ref 8.4–10.5)
Chloride: 100 mEq/L (ref 96–112)
Creatinine, Ser: 2.11 mg/dL — ABNORMAL HIGH (ref 0.50–1.10)
GFR calc Af Amer: 26 mL/min — ABNORMAL LOW (ref >90)
GFR, EST NON AFRICAN AMERICAN: 22 mL/min — AB (ref >90)
GLUCOSE: 105 mg/dL — AB (ref 70–99)
Potassium: 4.3 mEq/L (ref 3.7–5.3)
Sodium: 140 mEq/L (ref 137–147)

## 2014-02-11 LAB — CBC
HCT: 35.9 % — ABNORMAL LOW (ref 36.0–46.0)
HEMOGLOBIN: 11.8 g/dL — AB (ref 12.0–15.0)
MCH: 28.5 pg (ref 26.0–34.0)
MCHC: 32.9 g/dL (ref 30.0–36.0)
MCV: 86.7 fL (ref 78.0–100.0)
Platelets: 441 10*3/uL — ABNORMAL HIGH (ref 150–400)
RBC: 4.14 MIL/uL (ref 3.87–5.11)
RDW: 14.8 % (ref 11.5–15.5)
WBC: 15.2 10*3/uL — ABNORMAL HIGH (ref 4.0–10.5)

## 2014-02-11 MED ORDER — CARVEDILOL 12.5 MG PO TABS: 12.5000 mg | ORAL_TABLET | Freq: Two times a day (BID) | ORAL | Status: AC

## 2014-02-11 MED ORDER — ESOMEPRAZOLE MAGNESIUM 40 MG PO CPDR: 40.0000 mg | DELAYED_RELEASE_CAPSULE | Freq: Two times a day (BID) | ORAL | Status: AC

## 2014-02-11 MED ORDER — ONDANSETRON HCL 4 MG PO TABS: 4.0000 mg | ORAL_TABLET | Freq: Three times a day (TID) | ORAL | Status: AC | PRN

## 2014-02-11 MED ORDER — METOCLOPRAMIDE HCL 5 MG PO TABS: 5.0000 mg | ORAL_TABLET | Freq: Three times a day (TID) | ORAL | Status: AC

## 2014-02-11 MED ORDER — AMLODIPINE BESYLATE 10 MG PO TABS: 10.0000 mg | ORAL_TABLET | Freq: Every day | ORAL | Status: AC

## 2014-02-11 NOTE — Progress Notes (Signed)
PT Cancellation Note  Patient Details Name: Sheri Mccoy MRN: [id number] DOB: 1940/11/20   Cancelled Treatment:    Reason Eval/Treat Not Completed: Patient declined, no reason specified. Pt planning on d/c today and declined PT services.   Susa Bones LUBECK 02/11/2014, 11:12 AM

## 2014-02-11 NOTE — Discharge Summary (Signed)
PATIENT DETAILS Name: Sheri Mccoy Age: 73 y.o. Sex: female Date of Birth: 03/29/1941 MRN: [id number]. Admitting Physician: Alison Murray, MD WUJ:WJXBJY,NWGNFAO ROBERT, MD  Admit Date: 02/01/2014 Discharge date: 02/11/2014  Recommendations for Outpatient Follow-up:  1. Please check CBC/chemistries in 1 week 2. Please ensure patient has follow-up with Dr. Hayes-gastroenterology, Dr. Rodena Goldmann surgery.  PRIMARY DISCHARGE DIAGNOSIS:  Principal Problem:   Cellulitis Active Problems:   Hyponatremia   Hypokalemia   Nausea   HTN (hypertension)   HLD (hyperlipidemia)   Acute renal failure syndrome      PAST MEDICAL HISTORY: Past Medical History  Diagnosis Date  . Hypertension   . GERD (gastroesophageal reflux disease)   . Arthritis     DISCHARGE MEDICATIONS: Current Discharge Medication List    START taking these medications   Details  amLODipine (NORVASC) 10 MG tablet Take 1 tablet (10 mg total) by mouth daily. Qty: 30 tablet, Refills: 0    carvedilol (COREG) 12.5 MG tablet Take 1 tablet (12.5 mg total) by mouth 2 (two) times daily with a meal. Qty: 60 tablet, Refills: 0    metoCLOPramide (REGLAN) 5 MG tablet Take 1 tablet (5 mg total) by mouth 4 (four) times daily -  before meals and at bedtime. Qty: 90 tablet, Refills: 0      CONTINUE these medications which have CHANGED   Details  esomeprazole (NEXIUM) 40 MG capsule Take 1 capsule (40 mg total) by mouth 2 (two) times daily before a meal. Qty: 60 capsule, Refills: 0    ondansetron (ZOFRAN) 4 MG tablet Take 1 tablet (4 mg total) by mouth every 8 (eight) hours as needed for nausea or vomiting. Qty: 30 tablet, Refills: 0      CONTINUE these medications which have NOT CHANGED   Details  aspirin 81 MG tablet Take 81 mg by mouth daily.    atorvastatin (LIPITOR) 40 MG tablet Take 40 mg by mouth daily.    methocarbamol (ROBAXIN) 500 MG tablet Take 1 tablet (500 mg total) by mouth 3 (three) times daily  as needed. Qty: 60 tablet, Refills: 1    potassium chloride SA (K-DUR,KLOR-CON) 20 MEQ tablet Take 10 mEq by mouth daily.      STOP taking these medications     meloxicam (MOBIC) 7.5 MG tablet      telmisartan-hydrochlorothiazide (MICARDIS HCT) 40-12.5 MG per tablet         ALLERGIES:  No Known Allergies  BRIEF HPI:  See H&P, Labs, Consult and Test reports for all details in brief, patient is a 73 year old female with a history of hypertension, chronic venous stasis of her right lower extremity, was admitted for persistent nausea and vomiting.  CONSULTATIONS:   GI and general surgery  PERTINENT RADIOLOGIC STUDIES: Ct Abdomen Pelvis Wo Contrast  02/05/2014   CLINICAL DATA:  Upper abdominal pain and chest pain for a few days with nausea and vomiting.  EXAM: CT CHEST, ABDOMEN AND PELVIS WITHOUT CONTRAST  TECHNIQUE: Multidetector CT imaging of the chest, abdomen and pelvis was performed following the standard protocol without IV contrast.  COMPARISON:  None.  FINDINGS: CT CHEST FINDINGS  Lungs are adequately inflated and demonstrate minimal linear scarring/ atelectasis over the posterior bases. Heart is normal in size. Subtle possible calcification over the left anterior descending coronary artery. Minimal calcified plaque involving thoracic aorta. No evidence of hilar or axillary adenopathy. There is a moderate size hiatal hernia. There is a small contrast collection along the inferior aspect of the diaphragmatic  hiatus at the level of the hiatal hernia likely related patient's prior Nissen fundoplication.  CT ABDOMEN AND PELVIS FINDINGS  Abdominal images demonstrate evidence of a prior cholecystectomy. There is fatty atrophy of the pancreas. The spleen, liver and adrenal glands are within normal. Kidneys are normal in size, shape and position without evidence of nephrolithiasis. No evidence of obstruction. Ureters are within normal. Note that the mid to distal ureters are difficult to  visualize due to moderate streak artifact from patient's left hip prosthesis as well as barium from recent fluoroscopic evaluation. There is mild diverticulosis of the colon. Appendix is not definitely seen as there is moderate streak artifact over the cecum. There is calcified plaque over the abdominal aorta and iliac arteries. No evidence of bowel obstruction.  Subtle wall thickening involving the 3rd to 4th portion of the duodenum and proximal jejunum with very minimal ill definition of the fat planes adjacent the 3rd to 4th portion of the duodenum as cannot exclude peptic ulcer disease or regional enteritis.  Pelvic images demonstrate a calcified fibroid over the uterine fundus. There is a tiny amount of free fluid over the posterior pelvis. Bladder and rectum are otherwise unremarkable. There are mild degenerative changes of the spine and right hip.  IMPRESSION: Exam is somewhat limited due to streak artifact from retained barium and left hip prosthesis. There is subtle ill definition of the fat planes adjacent the 3rd to 4th portion of the duodenum with minimal wall thickening of this segment as well as proximal jejunum as cannot exclude peptic ulcer disease or regional enteritis.  No acute findings in the chest or pelvis.  Moderate size hiatal hernia with small contained contrast collection at the surgical site in this patient with prior Nissen fundoplication.  Minimal diverticulosis of the colon.  Tiny amount of nonspecific free fluid in the pelvis.   Electronically Signed   By: Elberta Fortisaniel  Boyle M.D.   On: 02/05/2014 13:08   Dg Chest 2 View  02/01/2014   CLINICAL DATA:  Cough.  EXAM: CHEST  2 VIEW  COMPARISON:  12/12/2013  FINDINGS: Borderline cardiomegaly remains stable. Mild bibasilar scarring is also unchanged. No evidence of pulmonary infiltrate or edema. No evidence of pleural effusion.  IMPRESSION: Stable exam.  No active disease.   Electronically Signed   By: Myles RosenthalJohn  Stahl M.D.   On: 02/01/2014 15:40     Ct Head Wo Contrast  02/06/2014   CLINICAL DATA:  Persistent nausea of uncertain etiology, history hypertension  EXAM: CT HEAD WITHOUT CONTRAST  TECHNIQUE: Contiguous axial images were obtained from the base of the skull through the vertex without intravenous contrast.  COMPARISON:  None.  FINDINGS: There is mild diffuse, confluent, periventricular white matter hypodensity, compatible with chronic small vessel white matter disease.  The gray-white differentiation is preserved.  There is no evidence of an acute infarct.  There is no acute intracranial hemorrhage.  There is no midline shift or mass effect.  There is no extra-axial fluid collection.  There is mild cerebral atrophy.  The paranasal sinuses and mastoid air cells are aerated.  The orbits are intact.  IMPRESSION: No acute intracranial process.   Electronically Signed   By: Fannie KneeKenneth  Crosby   On: 02/06/2014 13:56   Ct Chest Wo Contrast  02/05/2014   CLINICAL DATA:  Upper abdominal pain and chest pain for a few days with nausea and vomiting.  EXAM: CT CHEST, ABDOMEN AND PELVIS WITHOUT CONTRAST  TECHNIQUE: Multidetector CT imaging of the chest, abdomen  and pelvis was performed following the standard protocol without IV contrast.  COMPARISON:  None.  FINDINGS: CT CHEST FINDINGS  Lungs are adequately inflated and demonstrate minimal linear scarring/ atelectasis over the posterior bases. Heart is normal in size. Subtle possible calcification over the left anterior descending coronary artery. Minimal calcified plaque involving thoracic aorta. No evidence of hilar or axillary adenopathy. There is a moderate size hiatal hernia. There is a small contrast collection along the inferior aspect of the diaphragmatic hiatus at the level of the hiatal hernia likely related patient's prior Nissen fundoplication.  CT ABDOMEN AND PELVIS FINDINGS  Abdominal images demonstrate evidence of a prior cholecystectomy. There is fatty atrophy of the pancreas. The spleen, liver  and adrenal glands are within normal. Kidneys are normal in size, shape and position without evidence of nephrolithiasis. No evidence of obstruction. Ureters are within normal. Note that the mid to distal ureters are difficult to visualize due to moderate streak artifact from patient's left hip prosthesis as well as barium from recent fluoroscopic evaluation. There is mild diverticulosis of the colon. Appendix is not definitely seen as there is moderate streak artifact over the cecum. There is calcified plaque over the abdominal aorta and iliac arteries. No evidence of bowel obstruction.  Subtle wall thickening involving the 3rd to 4th portion of the duodenum and proximal jejunum with very minimal ill definition of the fat planes adjacent the 3rd to 4th portion of the duodenum as cannot exclude peptic ulcer disease or regional enteritis.  Pelvic images demonstrate a calcified fibroid over the uterine fundus. There is a tiny amount of free fluid over the posterior pelvis. Bladder and rectum are otherwise unremarkable. There are mild degenerative changes of the spine and right hip.  IMPRESSION: Exam is somewhat limited due to streak artifact from retained barium and left hip prosthesis. There is subtle ill definition of the fat planes adjacent the 3rd to 4th portion of the duodenum with minimal wall thickening of this segment as well as proximal jejunum as cannot exclude peptic ulcer disease or regional enteritis.  No acute findings in the chest or pelvis.  Moderate size hiatal hernia with small contained contrast collection at the surgical site in this patient with prior Nissen fundoplication.  Minimal diverticulosis of the colon.  Tiny amount of nonspecific free fluid in the pelvis.   Electronically Signed   By: Elberta Fortis M.D.   On: 02/05/2014 13:08   Nm Gastric Emptying  02/06/2014   CLINICAL DATA:  Left upper quadrant abdominal pain. Nausea and vomiting.  EXAM: NUCLEAR MEDICINE GASTRIC EMPTYING SCAN   TECHNIQUE: After oral ingestion of radiolabeled meal, sequential abdominal images were obtained for 120 minutes. Residual percentage of activity remaining within the stomach was calculated at 60 and 120 minutes.  RADIOPHARMACEUTICALS:  2 mCi technetium 99-m labeled sulfur colloid  COMPARISON:  Upper GI 05/23/2007 and CT abdomen and pelvis 02/05/2014  FINDINGS: Expected location of the stomach in the left upper quadrant. Ingested meal empties the stomach gradually over the course of the study with 27% retention at 60 min and 30% retention at 120 min (normal retention less than 30% at a 120 min).  IMPRESSION: Gastric emptying within normal limits.   Electronically Signed   By: Sebastian Ache   On: 02/06/2014 12:34   Dg Esophagus  02/04/2014   CLINICAL DATA:  Nausea and vomiting. Previous Nissen fundoplication. Question recurrent hiatal hernia or volvulus on endoscopy.  EXAM: ESOPHOGRAM/BARIUM SWALLOW  TECHNIQUE: Single contrast examination was performed  using  thin barium.  FLUOROSCOPY TIME:  1 min 40 seconds  COMPARISON:  None.  FINDINGS: Impression on the distal esophagus is seen consistent with history of previous Nissen fundoplication. A small paraesophageal hernia is seen at this site, which may be due to loosening of the fundoplication wrap.  There is no evidence of esophageal obstruction or mass. There is no evidence of volvulus. Mild dilatation of thoracic esophagus is seen with severe dysmotility including tertiary contractions.  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 No evidence of esophageal mass, obstruction, or volvulus.  Esophageal dysmotility.   Electronically Signed   By: Myles Rosenthal M.D.   On: 02/04/2014 17:08   US Renal  02/06/2014   CLINICAL DATA:  Acute renal failure.  EXAM: RENAL/URINARY TRACT ULTRASOUND COMPLETE  COMPARISON:  CT abdomen and pelvis 02/05/2014 and abdominal ultrasound  05/15/2007  FINDINGS: Right Kidney:  Length: 13.3 cm. Echogenicity within normal limits. No mass or hydronephrosis visualized.  Left Kidney:  Length: 14.3 cm. Echogenicity within normal limits. No mass or hydronephrosis visualized.  Bladder:  Appears normal for degree of bladder distention.  IMPRESSION: No hydronephrosis or other abnormality identified.   Electronically Signed   By: Sebastian Ache   On: 02/06/2014 09:36   Dg Abd 2 Views  02/03/2014   CLINICAL DATA:  Vomiting.  EXAM: ABDOMEN - 2 VIEW  COMPARISON:  Same day.  FINDINGS: The bowel gas pattern is normal. Status post cholecystectomy. There is no evidence of free air. No radio-opaque calculi or other significant radiographic abnormality is seen.  IMPRESSION: Bowel distention noted on prior exam has resolved. There is no evidence of bowel obstruction or ileus.   Electronically Signed   By: Roque Lias M.D.   On: 02/03/2014 10:21   Dg Abd Portable 1v  02/02/2014   CLINICAL DATA:  Abdominal pain.  EXAM: PORTABLE ABDOMEN - 1 VIEW  COMPARISON:  No prior .  FINDINGS: Soft tissue structures are unremarkable. Slightly distended air-filled loops of small large bowel noted. These findings most consistent with adynamic ileus. Surgical clips right upper quadrant consistent with cholecystectomy. Left hip replacement. Pelvic phleboliths. Degenerative changes lumbar spine right hip.  IMPRESSION: Findings most consistent with mild adynamic ileus.   Electronically Signed   By: Maisie Fus  Register   On: 02/02/2014 08:31     PERTINENT LAB RESULTS: CBC:  Recent Labs  02/10/14 0501 02/11/14 0753  WBC 15.0* 15.2*  HGB 10.8* 11.8*  HCT 34.1* 35.9*  PLT 375 441*   CMET CMP     Component Value Date/Time   NA 140 02/11/2014 0753   K 4.3 02/11/2014 0753   CL 100 02/11/2014 0753   CO2 25 02/11/2014 0753   GLUCOSE 105* 02/11/2014 0753   BUN 21 02/11/2014 0753   CREATININE 2.11* 02/11/2014 0753   CALCIUM 8.3* 02/11/2014 0753   PROT 6.6 02/05/2014 0859    ALBUMIN 3.1* 02/05/2014 0859   AST 26 02/05/2014 0859   ALT 26 02/05/2014 0859   ALKPHOS 116 02/05/2014 0859   BILITOT 0.3 02/05/2014 0859   GFRNONAA 22* 02/11/2014 0753   GFRAA 26* 02/11/2014 0753    GFR Estimated Creatinine Clearance: 27.1 mL/min (by C-G formula based on Cr of 2.11). No results for input(s): LIPASE, AMYLASE in the last 72 hours. No results for input(s): CKTOTAL, CKMB, CKMBINDEX, TROPONINI in the last 72 hours. Invalid input(s): POCBNP No results for input(s): DDIMER in the  last 72 hours. No results for input(s): HGBA1C in the last 72 hours. No results for input(s): CHOL, HDL, LDLCALC, TRIG, CHOLHDL, LDLDIRECT in the last 72 hours. No results for input(s): TSH, T4TOTAL, T3FREE, THYROIDAB in the last 72 hours.  Invalid input(s): FREET3 No results for input(s): VITAMINB12, FOLATE, FERRITIN, TIBC, IRON, RETICCTPCT in the last 72 hours. Coags: No results for input(s): INR in the last 72 hours.  Invalid input(s): PT Microbiology: Recent Results (from the past 240 hour(s))  Urine culture     Status: None   Collection Time: 02/01/14  2:56 PM  Result Value Ref Range Status   Specimen Description URINE, CLEAN CATCH  Final   Special Requests NONE  Final   Culture  Setup Time   Final    02/01/2014 23:54 Performed at [facility] Count   Final    20,OOO COLONIES/ML Performed at [facility]    Culture   Final    Multiple bacterial morphotypes present, none predominant. Suggest appropriate recollection if clinically indicated. Performed at [facility]    Report Status 02/02/2014 FINAL  Final  Culture, blood (routine x 2)     Status: None   Collection Time: 02/02/14 12:43 AM  Result Value Ref Range Status   Specimen Description BLOOD RIGHT HAND  Final   Special Requests BOTTLES DRAWN AEROBIC AND ANAEROBIC 4CC EA  Final   Culture  Setup Time   Final    02/02/2014 18:00 Performed at [facility]    Culture   Final     NO GROWTH 5 DAYS Performed at [facility]    Report Status 02/10/2014 FINAL  Final  Culture, blood (routine x 2)     Status: None   Collection Time: 02/02/14 12:48 AM  Result Value Ref Range Status   Specimen Description BLOOD LEFT HAND  Final   Special Requests BOTTLES DRAWN AEROBIC AND ANAEROBIC 5CC EA  Final   Culture  Setup Time   Final    02/02/2014 18:00 Performed at [facility]    Culture   Final    NO GROWTH 5 DAYS Performed at [facility]    Report Status 02/10/2014 FINAL  Final  Culture, blood (routine x 2)     Status: None (Preliminary result)   Collection Time: 02/07/14 11:55 AM  Result Value Ref Range Status   Specimen Description BLOOD LEFT HAND  Final   Special Requests BOTTLES DRAWN AEROBIC ONLY 10CC  Final   Culture  Setup Time   Final    02/07/2014 18:37 Performed at [facility]    Culture   Final           BLOOD CULTURE RECEIVED NO GROWTH TO DATE CULTURE WILL BE HELD FOR 5 DAYS BEFORE ISSUING A FINAL NEGATIVE REPORT Performed at [facility]    Report Status PENDING  Incomplete  Culture, blood (routine x 2)     Status: None (Preliminary result)   Collection Time: 02/07/14 12:10 PM  Result Value Ref Range Status   Specimen Description BLOOD LEFT HAND  Final   Special Requests BOTTLES DRAWN AEROBIC ONLY 5CC  Final   Culture  Setup Time   Final    02/07/2014 18:38 Performed at [facility]    Culture   Final           BLOOD CULTURE RECEIVED NO GROWTH TO DATE CULTURE WILL BE HELD FOR 5 DAYS BEFORE ISSUING A  FINAL NEGATIVE REPORT [facility]    Report Status PENDING  Incomplete     BRIEF HOSPITAL COURSE:  Brief Summary: 73 yo female with hx of HTN, Dyslipidemia, recent Right Knee Arthroplasty, recent UTI-admitted with several days hx of nausea-"dry heaving". She was found to have mild ileus of Abd Xray, mild cellulitis of her Right lower extremity. She was admitted for supportive  care, IV Abx. Unfortunately, with patient having BM's and inspite of radiologic improvement no improvement in her symptoms. GI was subsequently consulted and underwent EGD/CT Abd/chest and Gastric emptying study-all negative (findings below).she was managed with supportive care, and slowly started improving. By day of discharge was able to tolerate a regular diet. Please see below for hospital course by problem list.   Nausea/Vomiting:initially felt to be secondary to mild Ileus,however symptoms continuing in spite of radiological improvement and numerous bowel movements.Since she continued to have symptoms, GI consulted, EGD done on 11/3 which showed recurrence of her hiatal hernia.She then underwent CT Scan of abd/chest which was negative for major abnormaliteis. Gastric emptying study was negative. She was managed with supportive care. Improved with IV Reglan- changed to oral Reglan, advanced diet to soft diet on 11/9. No further nausea/upper abdominal pain. Etiology of nausea/upper abdominal pain not clear.  Active Problems:  Leukocytosis: ?stress margination-no site of infection apparent-her leg is at baseline (has chronic skin changes. UA not really suggestive of UTI, Urine cs positive for 20 K colonies of multiple bacterial morphotypes. Blood culture 11/1 on 11/6 neg so far. She was monitored off Abx. Please check CBC in 1 week.   Minimal Right Leg Cellulitis:significantly improved, per patient-she had chronic mild erythema, leg back to baseline. Empirically started on both vancomycin and Zosyn on admission, Zosyn discontinued on 11/2, since hardly any erythema Vancomycin stopped on 11/3.Leg continues to remains stable this am.Dopper RLE neg for DVT  ZOX:[id number]:suspect secondary to pre-renal process. Recent UA neg for proteinuria.Renal Ultrasound neg for hydronephroses. Strict I&O's, creatinine now slowly downtrending, euvolemic on exam. Tolerating diet well.Please repeat BMET in 1 week.    Hyponatremia:resolved, secondary to above   Hypokalemia: secondary to above.Please repeat BMET in 1 week    HTN (hypertension):better, continue with Amlodipine, Coreg-please follow and titrate accordingly as outpatient. Given improving renal failulre .Micardis/HCTZ remains on hold.   HLD (hyperlipidemia):resume statin   TODAY-DAY OF DISCHARGE:  Subjective:   Sheri Mccoy today has no headache,no chest abdominal pain,no new weakness tingling or numbness, feels much better wants to go home today.   Objective:   Blood pressure 130/74, pulse 76, temperature 97.9 F (36.6 C), temperature source Oral, resp. rate 16, height 5\' 4"  (1.626 m), weight 96.072 kg (211 lb 12.8 oz), SpO2 100 %.  Intake/Output Summary (Last 24 hours) at 02/11/14 0950 Last data filed at 02/11/14 0645  Gross per 24 hour  Intake    840 ml  Output      0 ml  Net    840 ml   Filed Weights   02/09/14 0521 02/10/14 0754 02/11/14 0500  Weight: 97.7 kg (215 lb 6.2 oz) 95.5 kg (210 lb 8.6 oz) 96.072 kg (211 lb 12.8 oz)    Exam Awake Alert, Oriented *3, No new F.N deficits, Normal affect [place].AT,PERRAL Supple Neck,No JVD, No cervical lymphadenopathy appriciated.  Symmetrical Chest wall movement, Good air movement bilaterally, CTAB RRR,No Gallops,Rubs or new Murmurs, No Parasternal Heave +ve B.Sounds, Abd Soft, Non tender, No organomegaly appriciated, No rebound -guarding or rigidity. No Cyanosis,  Clubbing or edema, No new Rash or bruise  DISCHARGE CONDITION: Stable  DISPOSITION: Home  DISCHARGE INSTRUCTIONS:    Activity:  As tolerated   Diet recommendation: Heart Healthy diet   Discharge Instructions    Call MD for:  persistant nausea and vomiting    Complete by:  As directed      Diet - low sodium heart healthy    Complete by:  As directed      Increase activity slowly    Complete by:  As directed            Follow-up Information    Follow up with HOXWORTH,BENJAMIN T, MD. Schedule an  appointment as soon as possible for a visit in 3 weeks.   Specialty:  General Surgery   Why:  For post-hospital follow up with Dr. Johna Sheriff.  The office should call you with an appointment time/date.     Contact information:   [address] [place] [address] [phone]       Follow up with Josue Hector, MD. Schedule an appointment as soon as possible for a visit in 1 week.   Specialty:  Family Medicine   Why:  please ask that a CBC/BMET be done at that visit.   Contact information:   [address] [facility] [address] [phone]       Follow up with HAYES,JOHN C, MD. Schedule an appointment as soon as possible for a visit in 2 weeks.   Specialty:  Gastroenterology   Contact information:   [address], [address] [place] [address] [phone]       Total Time spent on discharge equals 45 minutes.  SignedJeoffrey Massed 02/11/2014 9:50 AM

## 2014-02-13 LAB — CULTURE, BLOOD (ROUTINE X 2)
Culture: NO GROWTH
Culture: NO GROWTH

## 2014-07-18 ENCOUNTER — Encounter (HOSPITAL_COMMUNITY): Payer: Self-pay | Admitting: Anesthesiology

## 2015-08-31 IMAGING — CT CT ABD-PELV W/O CM
2 of 4 series · 7 of 36 positions shown, 8 images · non-contrast
Comparison: None.

CLINICAL DATA: Upper abdominal pain and chest pain for a few days
with nausea and vomiting.

EXAM:
CT CHEST, ABDOMEN AND PELVIS WITHOUT CONTRAST
TECHNIQUE: Multidetector CT imaging of the chest, abdomen and pelvis was
performed following the standard protocol without IV contrast.

[Series 201: cap with, idose (2) · axial · 0.94mm/px · z∈[+491,+881]mm · 4 of 118 slices shown, 5 images]
[im 20/118  mediastinal]
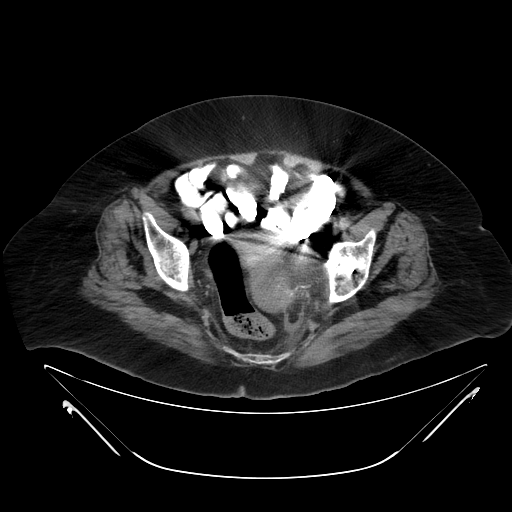
[im 20/118  lung]
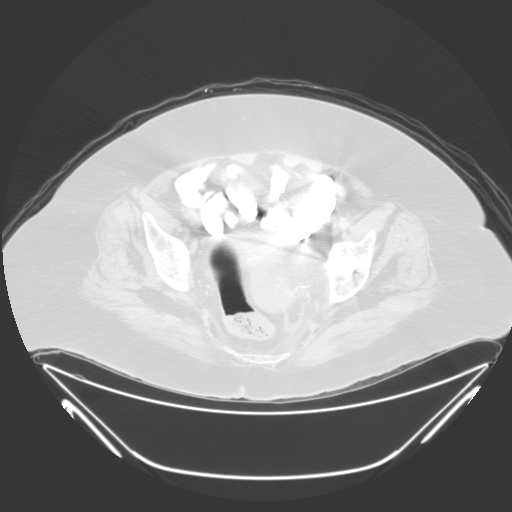
[im 46/118  lung]
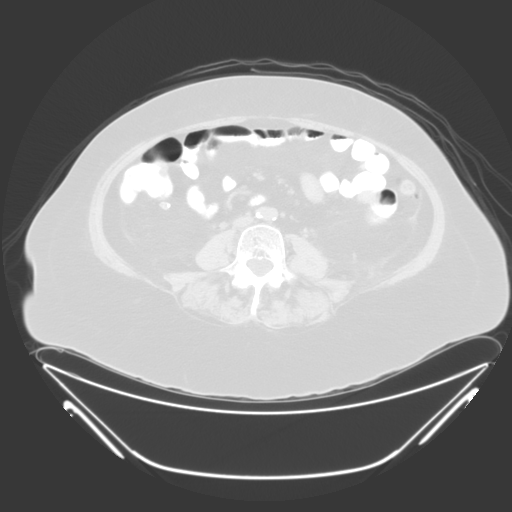
[im 72/118  lung]
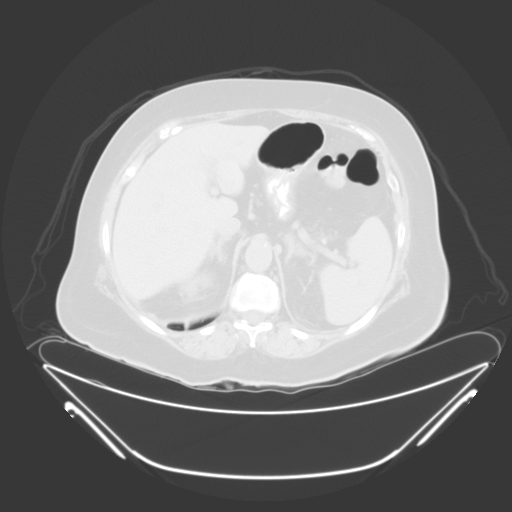
[im 98/118  lung]
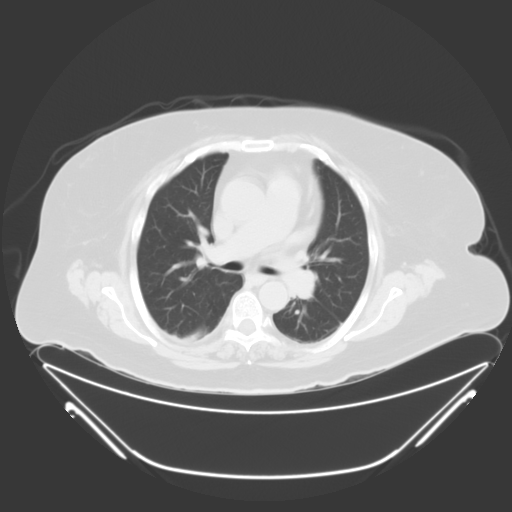

[Series 203: coronals, idose (2) · coronal · 0.50mm/px · 3 of 119 slices shown]
[im 24/119  lung]
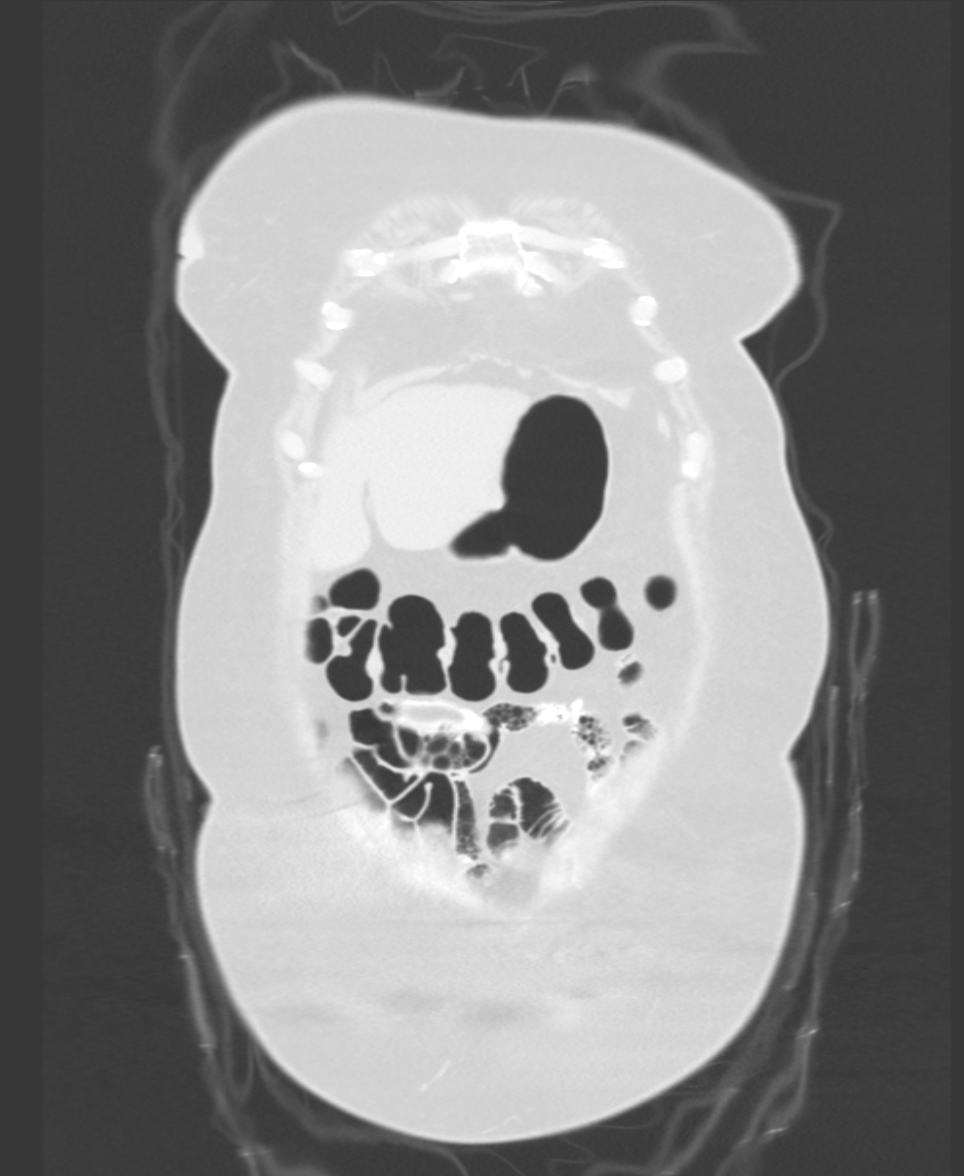
[im 48/119  lung]
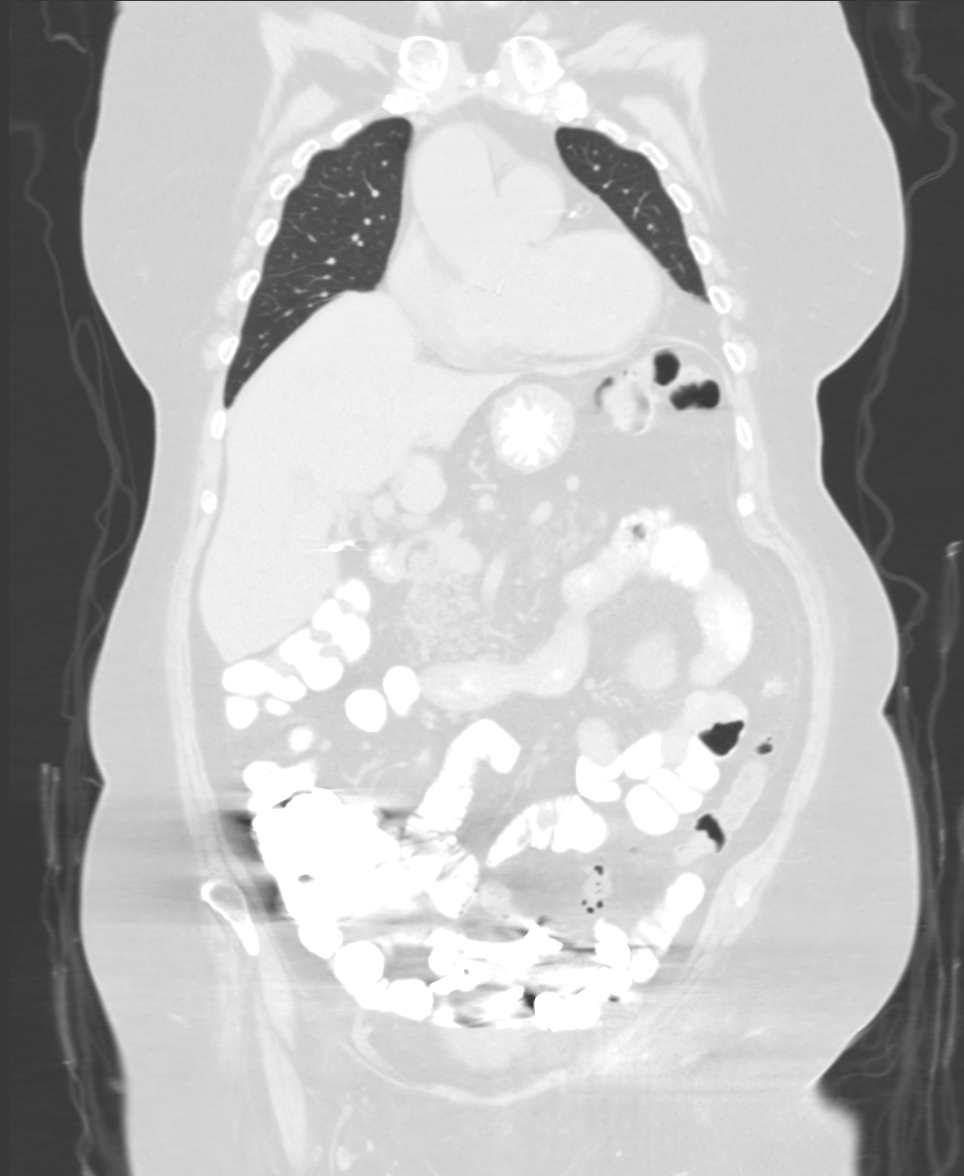
[im 71/119  lung]
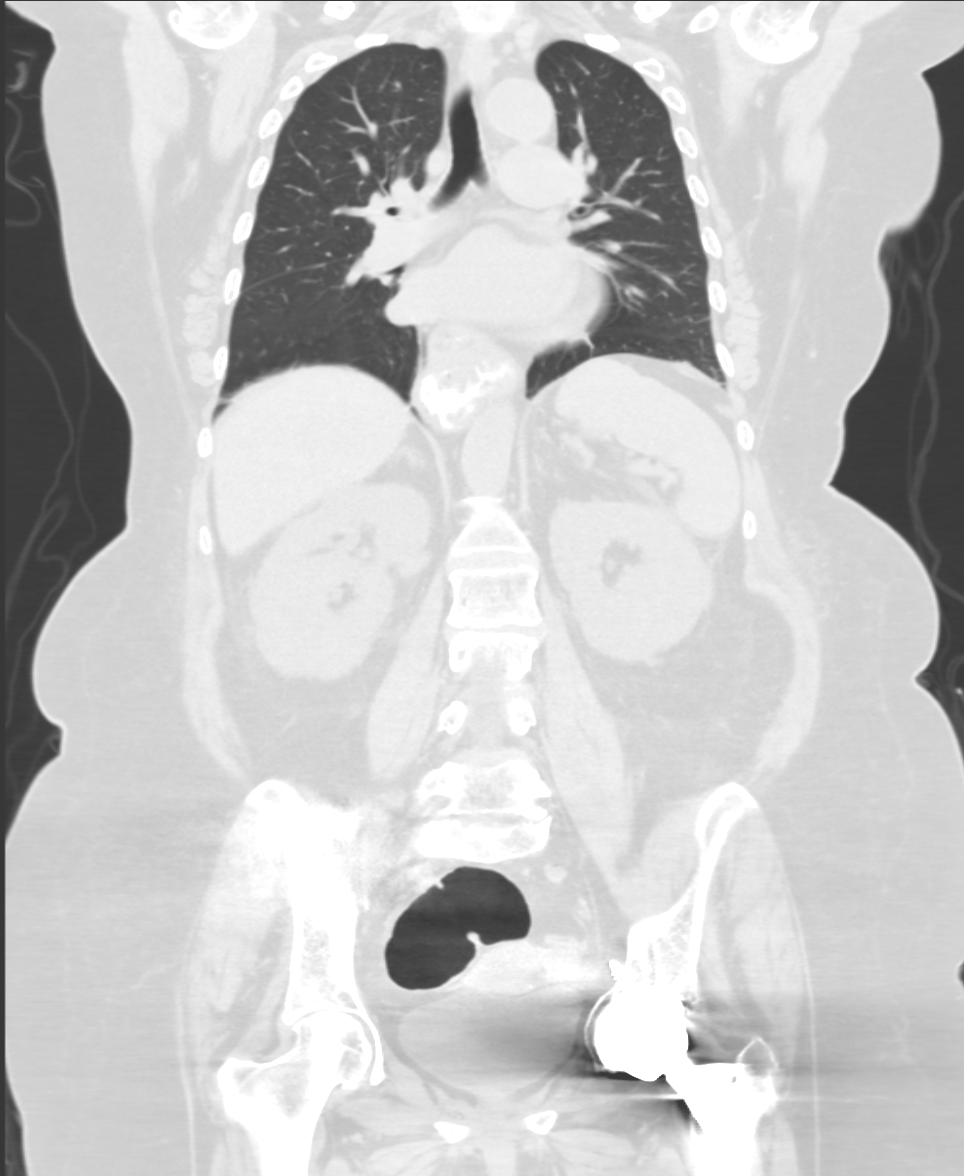

[7 of 36 positions shown; findings below may reference images not displayed]

FINDINGS: CT CHEST FINDINGS

Lungs are adequately inflated and demonstrate minimal linear
scarring/ atelectasis over the posterior bases. Heart is normal in
size. Subtle possible calcification over the left anterior
descending coronary artery. Minimal calcified plaque involving
thoracic aorta. No evidence of hilar or axillary adenopathy. There
is a moderate size hiatal hernia. There is a small contrast
collection along the inferior aspect of the diaphragmatic hiatus at
the level of the hiatal hernia likely related patient's prior Nissen
fundoplication.

CT ABDOMEN AND PELVIS FINDINGS

Abdominal images demonstrate evidence of a prior cholecystectomy.
There is fatty atrophy of the pancreas. The spleen, liver and
adrenal glands are within normal. Kidneys are normal in size, shape
and position without evidence of nephrolithiasis. No evidence of
obstruction. Ureters are within normal. Note that the mid to distal
ureters are difficult to visualize due to moderate streak artifact
from patient's left hip prosthesis as well as barium from recent
fluoroscopic evaluation. There is mild diverticulosis of the colon.
Appendix is not definitely seen as there is moderate streak artifact
over the cecum. There is calcified plaque over the abdominal aorta
and iliac arteries. No evidence of bowel obstruction.

Subtle wall thickening involving the 3rd to 4th portion of the
duodenum and proximal jejunum with very minimal ill definition of
the fat planes adjacent the 3rd to 4th portion of the duodenum as
cannot exclude peptic ulcer disease or regional enteritis.

Pelvic images demonstrate a calcified fibroid over the uterine
fundus. There is a tiny amount of free fluid over the posterior
pelvis. Bladder and rectum are otherwise unremarkable. There are
mild degenerative changes of the spine and right hip.
IMPRESSION: Exam is somewhat limited due to streak artifact from retained barium
and left hip prosthesis. There is subtle ill definition of the fat
planes adjacent the 3rd to 4th portion of the duodenum with minimal
wall thickening of this segment as well as proximal jejunum as
cannot exclude peptic ulcer disease or regional enteritis.

No acute findings in the chest or pelvis.

Moderate size hiatal hernia with small contained contrast collection
at the surgical site in this patient with prior Nissen
fundoplication.

Minimal diverticulosis of the colon.

Tiny amount of nonspecific free fluid in the pelvis.

## 2021-07-21 ENCOUNTER — Emergency Department (HOSPITAL_BASED_OUTPATIENT_CLINIC_OR_DEPARTMENT_OTHER): Payer: Medicare PPO

## 2021-07-21 ENCOUNTER — Encounter (HOSPITAL_BASED_OUTPATIENT_CLINIC_OR_DEPARTMENT_OTHER): Payer: Self-pay | Admitting: Emergency Medicine

## 2021-07-21 ENCOUNTER — Other Ambulatory Visit: Payer: Self-pay

## 2021-07-21 ENCOUNTER — Emergency Department (HOSPITAL_BASED_OUTPATIENT_CLINIC_OR_DEPARTMENT_OTHER)
Admission: EM | Admit: 2021-07-21 | Discharge: 2021-07-21 | Disposition: A | Discharge status: Home / Self Care | Payer: Medicare PPO | Attending: Emergency Medicine | Admitting: Emergency Medicine

## 2021-07-21 DIAGNOSIS — E079 Disorder of thyroid, unspecified: Secondary | ICD-10-CM | POA: Diagnosis not present

## 2021-07-21 DIAGNOSIS — Z7982 Long term (current) use of aspirin: Secondary | ICD-10-CM | POA: Insufficient documentation

## 2021-07-21 DIAGNOSIS — Z7901 Long term (current) use of anticoagulants: Secondary | ICD-10-CM | POA: Insufficient documentation

## 2021-07-21 DIAGNOSIS — W01198A Fall on same level from slipping, tripping and stumbling with subsequent striking against other object, initial encounter: Secondary | ICD-10-CM | POA: Diagnosis not present

## 2021-07-21 DIAGNOSIS — S0990XA Unspecified injury of head, initial encounter: Secondary | ICD-10-CM | POA: Insufficient documentation

## 2021-07-21 MED ORDER — ACETAMINOPHEN 500 MG PO TABS
1000.0000 mg | ORAL_TABLET | Freq: Once | ORAL | Status: AC
  Administered 2021-07-21: 1000 mg via ORAL
  Filled 2021-07-21: qty 2

## 2021-07-21 NOTE — ED Provider Notes (Signed)
?[facility] ?Provider Note ? ? ?CSN: [id number] ?Arrival date & time: 07/21/21  1600 ? ?  ? ?History ? ?Chief Complaint  ?Patient presents with  ? Fall  ? ? ?Sheri Mccoy is a 81 y.o. female. ? ? ?Fall ? ?Patient is an 81 year old female with a past medical history significant for A-fib presented emergency room today informs me that she tripped over her feet and fell forward smacking her head against the carpeted floor.  She did not lose consciousness has not had any nausea or vomiting.  Denies any pain other than mild frontal headache.  Denies any chest pain difficulty breathing.  She states that she tripped over her feet which she is prone to doing however she normally is able to catch herself.  Today she was unable to.  She denies any upper or lower extremity pain.  No back pain.  ? ? ? ?  ? ?Home Medications ?Prior to Admission medications   ?Medication Sig Start Date End Date Taking? Authorizing Provider  ?apixaban (ELIQUIS) 5 MG TABS tablet Take 5 mg by mouth 2 (two) times daily.   Yes [provider]  ?amLODipine (NORVASC) 10 MG tablet Take 1 tablet (10 mg total) by mouth daily. 02/11/14   Maretta Bees, MD  ?aspirin 81 MG tablet Take 81 mg by mouth daily.    [provider]  ?atorvastatin (LIPITOR) 40 MG tablet Take 40 mg by mouth daily.    [provider]  ?carvedilol (COREG) 12.5 MG tablet Take 1 tablet (12.5 mg total) by mouth 2 (two) times daily with a meal. 02/11/14   Ghimire, Werner Lean, MD  ?esomeprazole (NEXIUM) 40 MG capsule Take 1 capsule (40 mg total) by mouth 2 (two) times daily before a meal. 02/11/14   Ghimire, Werner Lean, MD  ?methocarbamol (ROBAXIN) 500 MG tablet Take 1 tablet (500 mg total) by mouth 3 (three) times daily as needed. 12/20/13   Beverely Low, MD  ?metoCLOPramide (REGLAN) 5 MG tablet Take 1 tablet (5 mg total) by mouth 4 (four) times daily -  before meals and at bedtime. 02/11/14   Ghimire, Werner Lean, MD  ?ondansetron  (ZOFRAN) 4 MG tablet Take 1 tablet (4 mg total) by mouth every 8 (eight) hours as needed for nausea or vomiting. 02/11/14   Ghimire, Werner Lean, MD  ?potassium chloride SA (K-DUR,KLOR-CON) 20 MEQ tablet Take 10 mEq by mouth daily.    [provider]  ?   ? ?Allergies    ?Patient has no known allergies.   ? ?Review of Systems   ?Review of Systems ? ?Physical Exam ?Updated Vital Signs ?BP (!) 154/79   Pulse 60   Temp 98.1 ?F (36.7 ?C)   Resp 18   SpO2 94%  ?Physical Exam ?Vitals and nursing note reviewed.  ?Constitutional:   ?   General: She is not in acute distress. ?HENT:  ?   Head: Normocephalic.  ?   Comments: Significant bruising to the right superior orbital bony crest. ?   Nose: Nose normal.  ?Eyes:  ?   General: No scleral icterus. ?   Comments: EOMI, no evidence of entrapment.  ?Neck:  ?   Comments: No significant C-spine tenderness ?Cardiovascular:  ?   Rate and Rhythm: Normal rate and regular rhythm.  ?   Pulses: Normal pulses.  ?   Heart sounds: Normal heart sounds.  ?Pulmonary:  ?   Effort: Pulmonary effort is normal. No respiratory distress.  ?  Breath sounds: No wheezing.  ?Abdominal:  ?   Palpations: Abdomen is soft.  ?   Tenderness: There is no abdominal tenderness.  ?Musculoskeletal:  ?   Cervical back: Normal range of motion.  ?   Right lower leg: No edema.  ?   Left lower leg: No edema.  ?   Comments: No bony tenderness over joints or long bones of the upper and lower extremities.   ? ?No neck or back midline tenderness, step-off, deformity, or bruising. Able to turn head left and right 45 degrees without difficulty. ? ?Full range of motion of upper and lower extremity joints shown after palpation was conducted; with 5/5 symmetrical strength in upper and lower extremities. No chest wall tenderness, no facial or cranial tenderness.  ? ?Patient has intact sensation grossly in lower and upper extremities. Intact patellar and ankle reflexes. Patient able to ambulate without difficulty.   ?Radial and DP pulses palpated BL.   ?Skin: ?   General: Skin is warm and dry.  ?   Capillary Refill: Capillary refill takes less than 2 seconds.  ?Neurological:  ?   Mental Status: She is alert. Mental status is at baseline.  ?Psychiatric:     ?   Mood and Affect: Mood normal.     ?   Behavior: Behavior normal.  ? ? ?ED Results / Procedures / Treatments   ?Labs ?(all labs ordered are listed, but only abnormal results are displayed) ?Labs Reviewed - No data to display ? ?EKG ?None ? ?Radiology ?CT Head Wo Contrast ? ?Result Date: 07/21/2021 ?CLINICAL DATA:  Head trauma. Fall, hitting the right side of the head on carpet. On anticoagulant. EXAM: CT HEAD WITHOUT CONTRAST TECHNIQUE: Contiguous axial images were obtained from the base of the skull through the vertex without intravenous contrast. RADIATION DOSE REDUCTION: This exam was performed according to the departmental dose-optimization program which includes automated exposure control, adjustment of the mA and/or kV according to patient size and/or use of iterative reconstruction technique. COMPARISON:  Head CT 02/06/2014 FINDINGS: Brain: There is no evidence of an acute infarct, intracranial hemorrhage, mass, midline shift, or extra-axial fluid collection. Mild cerebral atrophy is within normal limits for age. Hypodensities in the cerebral white matter bilaterally are nonspecific but compatible with mild chronic small vessel ischemic disease. Vascular: Calcified atherosclerosis at the skull base. No hyperdense vessel. Skull: No acute fracture or suspicious osseous lesion. Sinuses/Orbits: More fully evaluated on separate maxillofacial CT. Other: Large right forehead hematoma. IMPRESSION: 1. No evidence of acute intracranial abnormality. 2. Large right forehead hematoma. 3. Mild chronic small vessel ischemic disease. Electronically Signed   By: Sebastian Ache M.D.   On: 07/21/2021 17:15  ? ?CT Cervical Spine Wo Contrast ? ?Result Date: 07/21/2021 ?CLINICAL DATA:  Neck  trauma, midline tenderness (Age 84-64y) EXAM: CT CERVICAL SPINE WITHOUT CONTRAST TECHNIQUE: Multidetector CT imaging of the cervical spine was performed without intravenous contrast. Multiplanar CT image reconstructions were also generated. RADIATION DOSE REDUCTION: This exam was performed according to the departmental dose-optimization program which includes automated exposure control, adjustment of the mA and/or kV according to patient size and/or use of iterative reconstruction technique. COMPARISON:  None. FINDINGS: Alignment: Straightening.  No substantial sagittal subluxation. Skull base and vertebrae: No evidence of acute fracture. Osteopenia. Soft tissues and spinal canal: No prevertebral fluid or swelling. No visible canal hematoma. Disc levels:  Mild for age multilevel degenerative change. Upper chest: Visualized lung apices are clear. 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 IMPRESSION: 1. No evidence of acute fracture or traumatic malalignment in the cervical spine. 2. 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 Recommend thyroid ultrasound to further characterize. 3. Osteopenia. Electronically Signed   By: Feliberto HartsFrederick S Jones M.D.   On: 07/21/2021 17:17  ? ?CT Maxillofacial Wo Contrast ? ?Result Date: 07/21/2021 ?CLINICAL DATA:  Fall, facial trauma EXAM: CT MAXILLOFACIAL WITHOUT CONTRAST TECHNIQUE: Multidetector CT imaging of the maxillofacial structures was performed. Multiplanar CT image reconstructions were also generated. RADIATION DOSE REDUCTION: This exam was performed according to the departmental dose-optimization program which includes automated exposure control, adjustment of the mA and/or kV according to  patient size and/or use of iterative reconstruction technique. COMPARISON:  02/06/2014 head CT. FINDINGS: Osseous: No fracture or mandibular dislocation. No destructive process. Orbits: Negative. No traumat

## 2021-07-21 NOTE — ED Triage Notes (Signed)
Pt is on blood thinner. Pt has hx afib ?

## 2021-07-21 NOTE — ED Triage Notes (Signed)
Pt tripped and fell, hitting right side head on carpet. No LOC. ?

## 2021-07-21 NOTE — ED Notes (Signed)
Pt was at a funeral today and tripped on carpet.   ?No LOC, pt is on blood thinners ?+ hematoma to rt. [place] area ?

## 2021-07-21 NOTE — ED Notes (Signed)
Patient transported to CT 

## 2021-07-21 NOTE — Discharge Instructions (Signed)
Your work-up today has been quite reassuring from a trauma standpoint.  However there was an incidentally found mass in your thyroid.  Please follow-up with your primary care provider to plan neck steps /appropriate surveillance for this. ?

## 2023-01-16 ENCOUNTER — Emergency Department (HOSPITAL_BASED_OUTPATIENT_CLINIC_OR_DEPARTMENT_OTHER): Payer: Medicare PPO

## 2023-01-16 ENCOUNTER — Emergency Department (HOSPITAL_BASED_OUTPATIENT_CLINIC_OR_DEPARTMENT_OTHER)
Admission: EM | Admit: 2023-01-16 | Discharge: 2023-01-16 | Disposition: A | Discharge status: Home / Self Care | Payer: Medicare PPO | Attending: Emergency Medicine | Admitting: Emergency Medicine

## 2023-01-16 ENCOUNTER — Other Ambulatory Visit (HOSPITAL_BASED_OUTPATIENT_CLINIC_OR_DEPARTMENT_OTHER): Payer: Self-pay

## 2023-01-16 ENCOUNTER — Other Ambulatory Visit: Payer: Self-pay

## 2023-01-16 ENCOUNTER — Encounter (HOSPITAL_BASED_OUTPATIENT_CLINIC_OR_DEPARTMENT_OTHER): Payer: Self-pay | Admitting: Radiology

## 2023-01-16 DIAGNOSIS — M25511 Pain in right shoulder: Secondary | ICD-10-CM | POA: Insufficient documentation

## 2023-01-16 DIAGNOSIS — Z7901 Long term (current) use of anticoagulants: Secondary | ICD-10-CM | POA: Diagnosis not present

## 2023-01-16 DIAGNOSIS — Z23 Encounter for immunization: Secondary | ICD-10-CM | POA: Insufficient documentation

## 2023-01-16 DIAGNOSIS — W01198A Fall on same level from slipping, tripping and stumbling with subsequent striking against other object, initial encounter: Secondary | ICD-10-CM | POA: Insufficient documentation

## 2023-01-16 DIAGNOSIS — S0993XA Unspecified injury of face, initial encounter: Secondary | ICD-10-CM | POA: Diagnosis present

## 2023-01-16 DIAGNOSIS — S0285XA Fracture of orbit, unspecified, initial encounter for closed fracture: Secondary | ICD-10-CM | POA: Insufficient documentation

## 2023-01-16 DIAGNOSIS — I10 Essential (primary) hypertension: Secondary | ICD-10-CM | POA: Diagnosis not present

## 2023-01-16 DIAGNOSIS — Z7982 Long term (current) use of aspirin: Secondary | ICD-10-CM | POA: Diagnosis not present

## 2023-01-16 DIAGNOSIS — T148XXA Other injury of unspecified body region, initial encounter: Secondary | ICD-10-CM

## 2023-01-16 DIAGNOSIS — Z79899 Other long term (current) drug therapy: Secondary | ICD-10-CM | POA: Insufficient documentation

## 2023-01-16 DIAGNOSIS — W19XXXA Unspecified fall, initial encounter: Secondary | ICD-10-CM

## 2023-01-16 DIAGNOSIS — R519 Headache, unspecified: Secondary | ICD-10-CM | POA: Insufficient documentation

## 2023-01-16 DIAGNOSIS — H1131 Conjunctival hemorrhage, right eye: Secondary | ICD-10-CM | POA: Diagnosis not present

## 2023-01-16 DIAGNOSIS — S01511A Laceration without foreign body of lip, initial encounter: Secondary | ICD-10-CM | POA: Insufficient documentation

## 2023-01-16 LAB — CBC
HCT: 29.6 % — ABNORMAL LOW (ref 36.0–46.0)
Hemoglobin: 9.3 g/dL — ABNORMAL LOW (ref 12.0–15.0)
MCH: 27 pg (ref 26.0–34.0)
MCHC: 31.4 g/dL (ref 30.0–36.0)
MCV: 86 fL (ref 80.0–100.0)
Platelets: 404 10*3/uL — ABNORMAL HIGH (ref 150–400)
RBC: 3.44 MIL/uL — ABNORMAL LOW (ref 3.87–5.11)
RDW: 16.6 % — ABNORMAL HIGH (ref 11.5–15.5)
WBC: 14.1 10*3/uL — ABNORMAL HIGH (ref 4.0–10.5)
nRBC: 0 % (ref 0.0–0.2)

## 2023-01-16 LAB — PROTIME-INR
INR: 1.1 (ref 0.8–1.2)
Prothrombin Time: 13.9 s (ref 11.4–15.2)

## 2023-01-16 LAB — COMPREHENSIVE METABOLIC PANEL
ALT: 8 U/L (ref 0–44)
AST: 12 U/L — ABNORMAL LOW (ref 15–41)
Albumin: 3 g/dL — ABNORMAL LOW (ref 3.5–5.0)
Alkaline Phosphatase: 102 U/L (ref 38–126)
Anion gap: 13 (ref 5–15)
BUN: 40 mg/dL — ABNORMAL HIGH (ref 8–23)
CO2: 25 mmol/L (ref 22–32)
Calcium: 8.4 mg/dL — ABNORMAL LOW (ref 8.9–10.3)
Chloride: 99 mmol/L (ref 98–111)
Creatinine, Ser: 2.19 mg/dL — ABNORMAL HIGH (ref 0.44–1.00)
GFR, Estimated: 22 mL/min — ABNORMAL LOW (ref >60)
Glucose, Bld: 88 mg/dL (ref 70–99)
Potassium: 3.4 mmol/L — ABNORMAL LOW (ref 3.5–5.1)
Sodium: 137 mmol/L (ref 135–145)
Total Bilirubin: 0.4 mg/dL (ref 0.3–1.2)
Total Protein: 6 g/dL — ABNORMAL LOW (ref 6.5–8.1)

## 2023-01-16 LAB — ETHANOL: Alcohol, Ethyl (B): <10 mg/dL (ref <10)

## 2023-01-16 MED ORDER — FENTANYL CITRATE PF 50 MCG/ML IJ SOSY: 25.0000 ug | PREFILLED_SYRINGE | INTRAMUSCULAR | Status: DC | PRN

## 2023-01-16 MED ORDER — HYDROCODONE-ACETAMINOPHEN 5-325 MG PO TABS
1.0000 | ORAL_TABLET | Freq: Four times a day (QID) | ORAL | 0 refills | Status: AC | PRN
Start: 2023-01-16 — End: ?
  Filled 2023-01-16: qty 10, 3d supply, fill #0

## 2023-01-16 MED ORDER — AMOXICILLIN-POT CLAVULANATE 875-125 MG PO TABS
1.0000 | ORAL_TABLET | Freq: Two times a day (BID) | ORAL | 0 refills | Status: AC
  Filled 2023-01-16: qty 14, 7d supply, fill #0

## 2023-01-16 MED ORDER — ACETAMINOPHEN 325 MG PO TABS
650.0000 mg | ORAL_TABLET | Freq: Once | ORAL | Status: AC
  Administered 2023-01-16: 650 mg via ORAL
  Filled 2023-01-16: qty 2

## 2023-01-16 MED ORDER — HYDROCODONE-ACETAMINOPHEN 5-325 MG PO TABS
1.0000 | ORAL_TABLET | Freq: Once | ORAL | Status: AC
  Administered 2023-01-16: 1 via ORAL
  Filled 2023-01-16: qty 1

## 2023-01-16 MED ORDER — TETANUS-DIPHTH-ACELL PERTUSSIS 5-2.5-18.5 LF-MCG/0.5 IM SUSY
0.5000 mL | PREFILLED_SYRINGE | Freq: Once | INTRAMUSCULAR | Status: AC
  Administered 2023-01-16: 0.5 mL via INTRAMUSCULAR
  Filled 2023-01-16: qty 0.5

## 2023-01-16 NOTE — ED Provider Notes (Signed)
[place] EMERGENCY DEPARTMENT AT [facility] Provider Note   CSN: [id number] Arrival date & time: 01/16/23  1056     History  Chief Complaint  Patient presents with   Marletta Lor    Sheri Mccoy is a 82 y.o. female.   Fall     82 year old female with medical history significant for HTN, GERD, arthritis, atrial fibrillation on Eliquis who presents to the emergency department after a fall.  Patient presents as a level 2 trauma after a ground-level mechanical fall earlier this morning.  She states that she tripped over her own foot landing on the floor, striking her face and right shoulder.  No loss of consciousness.  She last took her Eliquis last night, did not take a dose this morning.  Since the fall, she has had pain on the right side of her face and in the right shoulder.  She has been ambulatory since the fall.  She also sustained acute skin tears to the right forearm which were bandaged by her granddaughter.  She endorses mild headache.  Her tetanus status is unknown.  She arrives GCS 15, ABC intact.  Home Medications Prior to Admission medications   Medication Sig Start Date End Date Taking? Authorizing Provider  amoxicillin-clavulanate (AUGMENTIN) 875-125 MG tablet Take 1 tablet by mouth every 12 (twelve) hours. 01/16/23  Yes Ernie Avena, MD  HYDROcodone-acetaminophen (NORCO/VICODIN) 5-325 MG tablet Take 1 tablet by mouth every 6 (six) hours as needed for severe pain (pain score 7-10). 01/16/23  Yes Ernie Avena, MD  amLODipine (NORVASC) 10 MG tablet Take 1 tablet (10 mg total) by mouth daily. 02/11/14   Ghimire, Werner Lean, MD  apixaban (ELIQUIS) 5 MG TABS tablet Take 5 mg by mouth 2 (two) times daily.    [provider]  aspirin 81 MG tablet Take 81 mg by mouth daily.    [provider]  atorvastatin (LIPITOR) 40 MG tablet Take 40 mg by mouth daily.    [provider]  carvedilol (COREG) 12.5 MG tablet Take 1 tablet (12.5 mg total) by  mouth 2 (two) times daily with a meal. 02/11/14   Ghimire, Werner Lean, MD  esomeprazole (NEXIUM) 40 MG capsule Take 1 capsule (40 mg total) by mouth 2 (two) times daily before a meal. 02/11/14   Ghimire, Werner Lean, MD  methocarbamol (ROBAXIN) 500 MG tablet Take 1 tablet (500 mg total) by mouth 3 (three) times daily as needed. 12/20/13   Beverely Low, MD  metoCLOPramide (REGLAN) 5 MG tablet Take 1 tablet (5 mg total) by mouth 4 (four) times daily -  before meals and at bedtime. 02/11/14   Ghimire, Werner Lean, MD  ondansetron (ZOFRAN) 4 MG tablet Take 1 tablet (4 mg total) by mouth every 8 (eight) hours as needed for nausea or vomiting. 02/11/14   Ghimire, Werner Lean, MD  potassium chloride SA (K-DUR,KLOR-CON) 20 MEQ tablet Take 10 mEq by mouth daily.    [provider]      Allergies    Patient has no known allergies.    Review of Systems   Review of Systems  All other systems reviewed and are negative.   Physical Exam Updated Vital Signs BP (!) 147/61 (BP Location: Right Arm)   Pulse 60   Temp 98.2 F (36.8 C) (Oral)   Resp 12   Ht 5\' 4"  (1.626 m)   Wt 96.1 kg   SpO2 99%   BMI 36.37 kg/m  Physical Exam Vitals and nursing note reviewed.  Constitutional:      General: She is not in acute distress.    Appearance: She is well-developed.     Comments: GCS 15, ABC intact  HENT:     Head: Normocephalic.     Comments: Right periorbital swelling, tenderness to palpation    Mouth/Throat:     Comments: Lower lip mucosal laceration, hemostatic Eyes:     Extraocular Movements:     Right eye: Normal extraocular motion.     Left eye: Normal extraocular motion.     Conjunctiva/sclera: Conjunctivae normal.     Pupils: Pupils are equal, round, and reactive to light.     Comments: Extraoccular movements intact, however limitation in lateral gaze of the right eye. Subconjunctival hemorrhage present.  Neck:     Comments: No midline tenderness to palpation of the cervical spine.  Range  of motion intact Cardiovascular:     Rate and Rhythm: Normal rate and regular rhythm.  Pulmonary:     Effort: Pulmonary effort is normal. No respiratory distress.     Breath sounds: Normal breath sounds.  Chest:     Comments: Clavicles stable nontender to AP compression.  Chest wall stable and nontender to AP and lateral compression. Abdominal:     Palpations: Abdomen is soft.     Tenderness: There is no abdominal tenderness.     Comments: Pelvis stable to lateral compression  Musculoskeletal:     Cervical back: Neck supple.     Comments: No midline tenderness to palpation of the thoracic or lumbar spine.  Extremities atraumatic with intact range of motion with the exception of 2 small skin tears to the right forearm, mild tenderness of the right shoulder, distally neurovascularly intact  Skin:    General: Skin is warm and dry.  Neurological:     Mental Status: She is alert.     Comments: Cranial nerves II through XII grossly intact.  Moving all 4 extremities spontaneously.  Sensation grossly intact all 4 extremities     ED Results / Procedures / Treatments   Labs (all labs ordered are listed, but only abnormal results are displayed) Labs Reviewed  COMPREHENSIVE METABOLIC PANEL - Abnormal; Notable for the following components:      Result Value   Potassium 3.4 (*)    BUN 40 (*)    Creatinine, Ser 2.19 (*)    Calcium 8.4 (*)    Total Protein 6.0 (*)    Albumin 3.0 (*)    AST 12 (*)    GFR, Estimated 22 (*)    All other components within normal limits  CBC - Abnormal; Notable for the following components:   WBC 14.1 (*)    RBC 3.44 (*)    Hemoglobin 9.3 (*)    HCT 29.6 (*)    RDW 16.6 (*)    Platelets 404 (*)    All other components within normal limits  ETHANOL  PROTIME-INR    EKG None  Radiology DG Shoulder Right  Result Date: 01/16/2023 CLINICAL DATA:  Pain after fall. EXAM: RIGHT SHOULDER - 3 VIEW COMPARISON:  None Available. FINDINGS: Severe osteopenia.  Preserved AC joint. Severe joint space loss of the glenohumeral joint with osteophyte formation. Frontal views are rotated on these portable radiographs limiting evaluation for subtle abnormality. Presumed pacemaker leads at the edge of the imaging field. IMPRESSION: Rotated radiographs limiting evaluation. Advanced degenerative changes of the glenohumeral joint with osteopenia. Electronically Signed   By: Karen Kays M.D.   On: 01/16/2023 14:27  DG Pelvis Portable  Result Date: 01/16/2023 CLINICAL DATA:  Pain after fall EXAM: PORTABLE PELVIS 1 VIEWS COMPARISON:  X-ray 11/30/2006 FINDINGS: Osteopenia. No acute fracture or dislocation. There is axial joint space loss of the right hip. 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 The distal tip of the femoral component is not included in the imaging field on this pelvis only x-ray. Presumed vascular calcifications in the pelvis. Possible calcified fibroid as well. With this level of osteopenia subtle nondisplaced injury is difficult to completely exclude and if needed additional workup as clinically appropriate. IMPRESSION: Osteopenia with degenerative changes. Left hip arthroplasty incompletely included in the imaging field. What is seen is similar to previous. Electronically Signed   By: Karen Kays M.D.   On: 01/16/2023 14:26   DG Chest Port 1 View  Result Date: 01/16/2023 CLINICAL DATA:  Pain after fall EXAM: PORTABLE CHEST 1 VIEW COMPARISON:  02/01/2014 FINDINGS: Underinflation. Enlarged cardiopericardial silhouette. Calcified aorta. Eventration of the right hemidiaphragm. No no edema or pneumothorax. Question left lung base opacity and small effusion. Film is rotated to the left. Osteopenia. Left upper chest pacemaker. Degenerative changes of the shoulders. IMPRESSION: Rotated underinflated radiograph. Question left lung base opacity and small effusion.  Recommend follow up including potentially a standard two-view x-ray when appropriate to further delineate. Enlarged heart.  Pacemaker. Electronically Signed   By: Karen Kays M.D.   On: 01/16/2023 14:24   CT HEAD WO CONTRAST  Result Date: 01/16/2023 CLINICAL DATA:  Fall, pain. Swelling and bruising noted to right eye and mouth. EXAM: CT HEAD WITHOUT CONTRAST CT MAXILLOFACIAL WITHOUT CONTRAST CT CERVICAL SPINE WITHOUT CONTRAST TECHNIQUE: Multidetector CT imaging of the head, cervical spine, and maxillofacial structures were performed using the standard protocol without intravenous contrast. Multiplanar CT image reconstructions of the cervical spine and maxillofacial structures were also generated. RADIATION DOSE REDUCTION: This exam was performed according to the departmental dose-optimization program which includes automated exposure control, adjustment of the mA and/or kV according to patient size and/or use of iterative reconstruction technique. COMPARISON:  CT head/facial bone/cervical spine 07/21/2021 FINDINGS: CT HEAD FINDINGS Brain: There is no acute intracranial hemorrhage, extra-axial fluid collection, or acute infarct. Parenchymal volume is within expected limits for age. The ventricles are normal in size. Hypodensity in the supratentorial white matter likely reflects underlying chronic small-vessel ischemic change. The pituitary and suprasellar region are normal. There is no mass lesion. There is no mass effect or midline shift. Vascular: There is calcification of the bilateral carotid siphons. Skull: Normal. Negative for fracture or focal lesion. Other: The mastoid air cells and middle ear cavities are clear. CT MAXILLOFACIAL FINDINGS Osseous: There an acute mildly depressed fracture of the right lamina papyracea with small locules of adjacent extraconal gas (4-63, 7-42, 46). No other facial bone fractures identified. There is no evidence of mandibular dislocation. Orbits: As above, there are small  locules of extraconal gas on the right. There is probable extraconal blood on the right (8-43). There is no retrobulbar hematoma or evidence of direct traumatic injury to either globe. Bilateral lens implants are noted. Sinuses: There is mild mucosal thickening and/or blood in the right ethmoid air cells. Soft tissues: There is right periorbital soft tissue swelling with a right cheek hematoma. CT CERVICAL SPINE FINDINGS Alignment: There is levocurvature centered at the cervicothoracic junction, similar to the prior study. There is trace anterolisthesis of C4 on C5, also not significantly changed. There  is no jumped or perched facet or other evidence of traumatic malalignment. Skull base and vertebrae: Skull base alignment is maintained. There is compression deformity of the T3 vertebral body with up to approximately 20% loss of vertebral body height which is new/progressed since the prior study from 2023 but otherwise age-indeterminate. The other vertebral body heights are preserved, without other evidence of acute fracture. There is no suspicious osseous lesion. Soft tissues and spinal canal: No prevertebral fluid or swelling. No visible canal hematoma. Disc levels: There is overall mild for age degenerative change throughout the cervical spine without evidence of high-grade spinal canal or neural foraminal stenosis. Upper chest: The imaged lung apices are clear. Other: The left thyroid lobe is enlarged, unchanged. IMPRESSION: 1. No acute intracranial hemorrhage or calvarial fracture. 2. Acute mildly depressed fracture of the right lamina papyracea with small locules of adjacent extraconal gas and probable extraconal blood. No direct traumatic injury to either globe. 3. Right periorbital soft tissue swelling with a right cheek hematoma. 4. Compression deformity of the T3 vertebral body with up to approximately 20% loss of vertebral body height is new/progressed since the prior study from 2023 but otherwise  age-indeterminate. Correlate with point tenderness. 5. No other evidence of acute fracture or traumatic malalignment of the cervical spine. 6. Enlarged left thyroid lobe, unchanged. Consider nonemergent thyroid ultrasound for further evaluation as clinically indicated. Electronically Signed   By: Lesia Hausen M.D.   On: 01/16/2023 14:04   CT MAXILLOFACIAL WO CONTRAST  Result Date: 01/16/2023 CLINICAL DATA:  Fall, pain. Swelling and bruising noted to right eye and mouth. EXAM: CT HEAD WITHOUT CONTRAST CT MAXILLOFACIAL WITHOUT CONTRAST CT CERVICAL SPINE WITHOUT CONTRAST TECHNIQUE: Multidetector CT imaging of the head, cervical spine, and maxillofacial structures were performed using the standard protocol without intravenous contrast. Multiplanar CT image reconstructions of the cervical spine and maxillofacial structures were also generated. RADIATION DOSE REDUCTION: This exam was performed according to the departmental dose-optimization program which includes automated exposure control, adjustment of the mA and/or kV according to patient size and/or use of iterative reconstruction technique. COMPARISON:  CT head/facial bone/cervical spine 07/21/2021 FINDINGS: CT HEAD FINDINGS Brain: There is no acute intracranial hemorrhage, extra-axial fluid collection, or acute infarct. Parenchymal volume is within expected limits for age. The ventricles are normal in size. Hypodensity in the supratentorial white matter likely reflects underlying chronic small-vessel ischemic change. The pituitary and suprasellar region are normal. There is no mass lesion. There is no mass effect or midline shift. Vascular: There is calcification of the bilateral carotid siphons. Skull: Normal. Negative for fracture or focal lesion. Other: The mastoid air cells and middle ear cavities are clear. CT MAXILLOFACIAL FINDINGS Osseous: There an acute mildly depressed fracture of the right lamina papyracea with small locules of adjacent extraconal gas  (4-63, 7-42, 46). No other facial bone fractures identified. There is no evidence of mandibular dislocation. Orbits: As above, there are small locules of extraconal gas on the right. There is probable extraconal blood on the right (8-43). There is no retrobulbar hematoma or evidence of direct traumatic injury to either globe. Bilateral lens implants are noted. Sinuses: There is mild mucosal thickening and/or blood in the right ethmoid air cells. Soft tissues: There is right periorbital soft tissue swelling with a right cheek hematoma. CT CERVICAL SPINE FINDINGS Alignment: There is levocurvature centered at the cervicothoracic junction, similar to the prior study. There is trace anterolisthesis of C4 on C5, also not significantly changed. There is no jumped or  perched facet or other evidence of traumatic malalignment. Skull base and vertebrae: Skull base alignment is maintained. There is compression deformity of the T3 vertebral body with up to approximately 20% loss of vertebral body height which is new/progressed since the prior study from 2023 but otherwise age-indeterminate. The other vertebral body heights are preserved, without other evidence of acute fracture. There is no suspicious osseous lesion. Soft tissues and spinal canal: No prevertebral fluid or swelling. No visible canal hematoma. Disc levels: There is overall mild for age degenerative change throughout the cervical spine without evidence of high-grade spinal canal or neural foraminal stenosis. Upper chest: The imaged lung apices are clear. Other: The left thyroid lobe is enlarged, unchanged. IMPRESSION: 1. No acute intracranial hemorrhage or calvarial fracture. 2. Acute mildly depressed fracture of the right lamina papyracea with small locules of adjacent extraconal gas and probable extraconal blood. No direct traumatic injury to either globe. 3. Right periorbital soft tissue swelling with a right cheek hematoma. 4. Compression deformity of the T3  vertebral body with up to approximately 20% loss of vertebral body height is new/progressed since the prior study from 2023 but otherwise age-indeterminate. Correlate with point tenderness. 5. No other evidence of acute fracture or traumatic malalignment of the cervical spine. 6. Enlarged left thyroid lobe, unchanged. Consider nonemergent thyroid ultrasound for further evaluation as clinically indicated. Electronically Signed   By: Lesia Hausen M.D.   On: 01/16/2023 14:04   CT CERVICAL SPINE WO CONTRAST  Result Date: 01/16/2023 CLINICAL DATA:  Fall, pain. Swelling and bruising noted to right eye and mouth. EXAM: CT HEAD WITHOUT CONTRAST CT MAXILLOFACIAL WITHOUT CONTRAST CT CERVICAL SPINE WITHOUT CONTRAST TECHNIQUE: Multidetector CT imaging of the head, cervical spine, and maxillofacial structures were performed using the standard protocol without intravenous contrast. Multiplanar CT image reconstructions of the cervical spine and maxillofacial structures were also generated. RADIATION DOSE REDUCTION: This exam was performed according to the departmental dose-optimization program which includes automated exposure control, adjustment of the mA and/or kV according to patient size and/or use of iterative reconstruction technique. COMPARISON:  CT head/facial bone/cervical spine 07/21/2021 FINDINGS: CT HEAD FINDINGS Brain: There is no acute intracranial hemorrhage, extra-axial fluid collection, or acute infarct. Parenchymal volume is within expected limits for age. The ventricles are normal in size. Hypodensity in the supratentorial white matter likely reflects underlying chronic small-vessel ischemic change. The pituitary and suprasellar region are normal. There is no mass lesion. There is no mass effect or midline shift. Vascular: There is calcification of the bilateral carotid siphons. Skull: Normal. Negative for fracture or focal lesion. Other: The mastoid air cells and middle ear cavities are clear. CT  MAXILLOFACIAL FINDINGS Osseous: There an acute mildly depressed fracture of the right lamina papyracea with small locules of adjacent extraconal gas (4-63, 7-42, 46). No other facial bone fractures identified. There is no evidence of mandibular dislocation. Orbits: As above, there are small locules of extraconal gas on the right. There is probable extraconal blood on the right (8-43). There is no retrobulbar hematoma or evidence of direct traumatic injury to either globe. Bilateral lens implants are noted. Sinuses: There is mild mucosal thickening and/or blood in the right ethmoid air cells. Soft tissues: There is right periorbital soft tissue swelling with a right cheek hematoma. CT CERVICAL SPINE FINDINGS Alignment: There is levocurvature centered at the cervicothoracic junction, similar to the prior study. There is trace anterolisthesis of C4 on C5, also not significantly changed. There is no jumped or perched facet or  other evidence of traumatic malalignment. Skull base and vertebrae: Skull base alignment is maintained. There is compression deformity of the T3 vertebral body with up to approximately 20% loss of vertebral body height which is new/progressed since the prior study from 2023 but otherwise age-indeterminate. The other vertebral body heights are preserved, without other evidence of acute fracture. There is no suspicious osseous lesion. Soft tissues and spinal canal: No prevertebral fluid or swelling. No visible canal hematoma. Disc levels: There is overall mild for age degenerative change throughout the cervical spine without evidence of high-grade spinal canal or neural foraminal stenosis. Upper chest: The imaged lung apices are clear. Other: The left thyroid lobe is enlarged, unchanged. IMPRESSION: 1. No acute intracranial hemorrhage or calvarial fracture. 2. Acute mildly depressed fracture of the right lamina papyracea with small locules of adjacent extraconal gas and probable extraconal blood. No  direct traumatic injury to either globe. 3. Right periorbital soft tissue swelling with a right cheek hematoma. 4. Compression deformity of the T3 vertebral body with up to approximately 20% loss of vertebral body height is new/progressed since the prior study from 2023 but otherwise age-indeterminate. Correlate with point tenderness. 5. No other evidence of acute fracture or traumatic malalignment of the cervical spine. 6. Enlarged left thyroid lobe, unchanged. Consider nonemergent thyroid ultrasound for further evaluation as clinically indicated. Electronically Signed   By: Lesia Hausen M.D.   On: 01/16/2023 14:04    Procedures Procedures    Medications Ordered in ED Medications  fentaNYL (SUBLIMAZE) injection 25 mcg (has no administration in time range)  HYDROcodone-acetaminophen (NORCO/VICODIN) 5-325 MG per tablet 1 tablet (has no administration in time range)  acetaminophen (TYLENOL) tablet 650 mg (650 mg Oral Given 01/16/23 1227)  Tdap (BOOSTRIX) injection 0.5 mL (0.5 mLs Intramuscular Given 01/16/23 1223)    ED Course/ Medical Decision Making/ A&P                                 Medical Decision Making Amount and/or Complexity of Data Reviewed Labs: ordered. Radiology: ordered.  Risk OTC drugs. Prescription drug management.     82 year old female with medical history significant for HTN, GERD, arthritis, atrial fibrillation on Eliquis who presents to the emergency department after a fall.  Patient presents as a level 2 trauma after a ground-level mechanical fall earlier this morning.  She states that she tripped over her own foot landing on the floor, striking her face and right shoulder.  No loss of consciousness.  She last took her Eliquis last night, did not take a dose this morning.  Since the fall, she has had pain on the right side of her face and in the right shoulder.  She has been ambulatory since the fall.  She also sustained acute skin tears to the right forearm which  were bandaged by her granddaughter.  She endorses mild headache.  Her tetanus status is unknown.  She arrives GCS 15, ABC intact.   On arrival, vitals stable, afebrile, not tachycardic or tachypneic, saturating well on room air, mildly hypertensive BP 140/56.  Sinus rhythm noted on telemetry.. Currently, she is awake, alert, and protecting her own airway and is hemodynamically stable.  Trauma imaging revealed (full reports in EMR): Portable CXR:  No evidence of pneumothorax or tracheal deviation IMPRESSION:  Rotated underinflated radiograph. Question left lung base opacity  and small effusion. Recommend follow up including potentially a  standard two-view x-ray when appropriate  to further delineate.    Enlarged heart.  Pacemaker.   Portable Pelvis:  IMPRESSION:  Osteopenia with degenerative changes. Left hip arthroplasty  incompletely included in the imaging field. What is seen is similar  to previous.   CT Head and Cervical Spine and MaxFace: IMPRESSION:  1. No acute intracranial hemorrhage or calvarial fracture.  2. Acute mildly depressed fracture of the right lamina papyracea  with small locules of adjacent extraconal gas and probable  extraconal blood. No direct traumatic injury to either globe.  3. Right periorbital soft tissue swelling with a right cheek  hematoma.  4. Compression deformity of the T3 vertebral body with up to  approximately 20% loss of vertebral body height is new/progressed  since the prior study from 2023 but otherwise age-indeterminate.  Correlate with point tenderness.  5. No other evidence of acute fracture or traumatic malalignment of  the cervical spine.  6. Enlarged left thyroid lobe, unchanged. Consider nonemergent  thyroid ultrasound for further evaluation as clinically indicated.   XR Right Shoulder: Narrative  CLINICAL DATA:  Pain after fall.    EXAM:  RIGHT SHOULDER - 3 VIEW    COMPARISON:  None Available.    FINDINGS:  Severe  osteopenia. Preserved AC joint. Severe joint space loss of  the glenohumeral joint with osteophyte formation. Frontal views are  rotated on these portable radiographs limiting evaluation for subtle  abnormality. Presumed pacemaker leads at the edge of the imaging  field.    IMPRESSION:  Rotated radiographs limiting evaluation. Advanced degenerative  changes of the glenohumeral joint with osteopenia.    Labs: CMP with mild hypokalemia to 3.4, creatinine at baseline at 2.19, CBC with a nonspecific leukocytosis to 14 in the setting of the patient's trauma, hemoglobin 9.3, baseline appears to be between 9 and 10 per recent labs in Care Everywhere.  Patient's tetanus was updated.  She received Tylenol for pain control.  The patient has some evidence of entrapment on evaluation of extraocular movements, there was no evidence of globe injury on CT imaging.  I spoke with Dr. Allena Katz of on-call ophthalmology who recommended evaluation by oculoplastics.  I spoke with the [facility] transfer line regarding the patient and they accepted the patient in ER to ER transfer for evaluation by oculoplastics in person.  I discussed the care of the patient and the plan of care with the patient's daughter who ultimately declined to ER to ER transfer and instead opted to sign out AGAINST MEDICAL ADVICE.  I explained the risk of missed diagnosis, delayed diagnosis, delayed care which could result in significant morbidity.  Norco was prescribed in addition to Augmentin for pain control and the patient was advised to follow-up urgently with an ophthalmologist for a repeat evaluation.  Final Clinical Impression(s) / ED Diagnoses Final diagnoses:  Closed fracture of orbital wall, initial encounter (HCC)  Subconjunctival hemorrhage of right eye  Fall, initial encounter  Abrasion of skin  Lip laceration, initial encounter    Rx / DC Orders ED Discharge Orders          Ordered    HYDROcodone-acetaminophen (NORCO/VICODIN)  5-325 MG tablet  Every 6 hours PRN        01/16/23 1535    amoxicillin-clavulanate (AUGMENTIN) 875-125 MG tablet  Every 12 hours        01/16/23 1537              Ernie Avena, MD 01/16/23 1538

## 2023-01-16 NOTE — Discharge Instructions (Addendum)
Follow-up with oculoplastics outpatient.  I discussed transfer to [facility] for evaluation by an oculoplastic specialist in the emergency department as there is some concern for entrapment with an orbital wall fracture.  Pain medicine has been prescribed.  The risk of leaving AGAINST MEDICAL ADVICE include missed diagnosis, missed ability to be managed in a timely fashion by a subspecialist surgeon which could result in significant morbidity.  Please follow-up outpatient with an ophthalmologist, preferably a specialist in oculoplastics which is available through [facility].

## 2023-01-16 NOTE — ED Notes (Signed)
[facility]; informed that pt is leaving AMA; cancel tx orders

## 2023-01-16 NOTE — ED Triage Notes (Signed)
Pt via pov from home with daughter after a fall this morning. Pt does not know what caused her to fall, but fall was witnessed by husband, who states she did not lose consciousness and that she "tripped over her feet." Pt takes eliquis . Swelling and bruising noted to her right eye and mouth. Pt states she can see out of the eye. Pt alert & oriented, HOH. NAD noted.

## 2023-01-16 NOTE — ED Notes (Signed)
Pt refused medically appropriate transfer to another health care facility. Pt understood, agreed, and signed AMA form. Pt went home in the care of her daughter.

## 2023-06-29 ENCOUNTER — Telehealth: Payer: Self-pay | Admitting: Pediatrics

## 2023-06-29 NOTE — Telephone Encounter (Signed)
 Good Morning Dr Doy Hutching   Patient preferred provider   We received a call from patient requesting to transfer gastro care over to you.   Patient previously with [facility] in 2024. Records are avail;able for review in epic. Please review and advise on scheduling.    Thank you

## 2023-07-11 NOTE — Telephone Encounter (Signed)
 Called patient to advise on recommendations . Unable to reach .
